# Patient Record
Sex: Male | Born: 1960 | Race: Black or African American | Hispanic: No | Marital: Single
Health system: Southern US, Community
[De-identification: ages and names within clinical notes are randomized; demographics above are authoritative.]

## PROBLEM LIST (undated history)

## (undated) DIAGNOSIS — F1010 Alcohol abuse, uncomplicated: Secondary | ICD-10-CM

## (undated) DIAGNOSIS — J45909 Unspecified asthma, uncomplicated: Secondary | ICD-10-CM

## (undated) DIAGNOSIS — I10 Essential (primary) hypertension: Secondary | ICD-10-CM

## (undated) DIAGNOSIS — F1910 Other psychoactive substance abuse, uncomplicated: Secondary | ICD-10-CM

## (undated) DIAGNOSIS — R569 Unspecified convulsions: Secondary | ICD-10-CM

---

## 1999-05-27 ENCOUNTER — Inpatient Hospital Stay (HOSPITAL_COMMUNITY): Admission: EM | Admit: 1999-05-27 | Discharge: 1999-06-04 | Payer: Self-pay | Admitting: Emergency Medicine

## 1999-05-27 ENCOUNTER — Encounter: Payer: Self-pay | Admitting: *Deleted

## 1999-05-29 ENCOUNTER — Encounter: Payer: Self-pay | Admitting: Internal Medicine

## 1999-05-29 ENCOUNTER — Encounter: Payer: Self-pay | Admitting: Family Medicine

## 1999-06-07 ENCOUNTER — Encounter: Admission: RE | Admit: 1999-06-07 | Discharge: 1999-06-07 | Payer: Self-pay | Admitting: Family Medicine

## 2001-08-16 ENCOUNTER — Emergency Department (HOSPITAL_COMMUNITY): Admission: EM | Admit: 2001-08-16 | Discharge: 2001-08-16 | Payer: Self-pay | Admitting: Emergency Medicine

## 2013-01-21 ENCOUNTER — Emergency Department: Payer: Self-pay | Admitting: Emergency Medicine

## 2013-01-21 LAB — TROPONIN I: Troponin-I: <0.02 ng/mL

## 2013-01-21 LAB — PROTIME-INR: Prothrombin Time: 12.2 secs (ref 11.5–14.7)

## 2013-01-21 LAB — CBC
HCT: 41.3 % (ref 40.0–52.0)
HGB: 13.9 g/dL (ref 13.0–18.0)
MCHC: 33.7 g/dL (ref 32.0–36.0)
MCV: 93 fL (ref 80–100)
RDW: 12.3 % (ref 11.5–14.5)

## 2013-01-21 LAB — BASIC METABOLIC PANEL
Anion Gap: 5 — ABNORMAL LOW (ref 7–16)
BUN: 23 mg/dL — ABNORMAL HIGH (ref 7–18)
Calcium, Total: 9.5 mg/dL (ref 8.5–10.1)
Chloride: 106 mmol/L (ref 98–107)
Co2: 23 mmol/L (ref 21–32)
EGFR (Non-African Amer.): >60
Glucose: 104 mg/dL — ABNORMAL HIGH (ref 65–99)
Sodium: 134 mmol/L — ABNORMAL LOW (ref 136–145)

## 2013-01-21 LAB — APTT: Activated PTT: 30 secs (ref 23.6–35.9)

## 2013-01-22 LAB — LIPID PANEL
HDL Cholesterol: 37 mg/dL — ABNORMAL LOW (ref 40–60)
Ldl Cholesterol, Calc: 113 mg/dL — ABNORMAL HIGH (ref 0–100)
Triglycerides: 99 mg/dL (ref 0–200)
VLDL Cholesterol, Calc: 20 mg/dL (ref 5–40)

## 2013-01-22 LAB — CBC WITH DIFFERENTIAL/PLATELET
Basophil #: 0 10*3/uL (ref 0.0–0.1)
Basophil %: 0.6 %
Eosinophil %: 6.4 %
Lymphocyte #: 2.2 10*3/uL (ref 1.0–3.6)
Monocyte %: 14.7 %
Platelet: 233 10*3/uL (ref 150–440)
RDW: 12.1 % (ref 11.5–14.5)

## 2013-01-22 LAB — TROPONIN I
Troponin-I: <0.02 ng/mL
Troponin-I: <0.02 ng/mL

## 2013-01-22 LAB — MAGNESIUM: Magnesium: 1.7 mg/dL — ABNORMAL LOW

## 2013-05-13 ENCOUNTER — Emergency Department (HOSPITAL_COMMUNITY)
Admission: EM | Admit: 2013-05-13 | Discharge: 2013-05-13 | Disposition: A | Discharge status: Home / Self Care | Payer: Medicaid Other | Attending: Emergency Medicine | Admitting: Emergency Medicine

## 2013-05-13 ENCOUNTER — Encounter (HOSPITAL_COMMUNITY): Payer: Self-pay | Admitting: Emergency Medicine

## 2013-05-13 ENCOUNTER — Emergency Department (HOSPITAL_COMMUNITY): Payer: Medicaid Other

## 2013-05-13 DIAGNOSIS — G40909 Epilepsy, unspecified, not intractable, without status epilepticus: Secondary | ICD-10-CM | POA: Insufficient documentation

## 2013-05-13 DIAGNOSIS — Z880 Allergy status to penicillin: Secondary | ICD-10-CM | POA: Insufficient documentation

## 2013-05-13 DIAGNOSIS — I10 Essential (primary) hypertension: Secondary | ICD-10-CM | POA: Insufficient documentation

## 2013-05-13 DIAGNOSIS — J45901 Unspecified asthma with (acute) exacerbation: Secondary | ICD-10-CM

## 2013-05-13 DIAGNOSIS — Z79899 Other long term (current) drug therapy: Secondary | ICD-10-CM | POA: Insufficient documentation

## 2013-05-13 DIAGNOSIS — F17200 Nicotine dependence, unspecified, uncomplicated: Secondary | ICD-10-CM | POA: Insufficient documentation

## 2013-05-13 HISTORY — DX: Other psychoactive substance abuse, uncomplicated: F19.10

## 2013-05-13 HISTORY — DX: Alcohol abuse, uncomplicated: F10.10

## 2013-05-13 HISTORY — DX: Essential (primary) hypertension: I10

## 2013-05-13 HISTORY — DX: Unspecified convulsions: R56.9

## 2013-05-13 HISTORY — DX: Unspecified asthma, uncomplicated: J45.909

## 2013-05-13 LAB — CBC
HCT: 41.6 % (ref 39.0–52.0)
HEMOGLOBIN: 13.4 g/dL (ref 13.0–17.0)
MCH: 31.6 pg (ref 26.0–34.0)
MCHC: 32.2 g/dL (ref 30.0–36.0)
MCV: 98.1 fL (ref 78.0–100.0)
Platelets: 269 10*3/uL (ref 150–400)
RBC: 4.24 MIL/uL (ref 4.22–5.81)
RDW: 13.2 % (ref 11.5–15.5)
WBC: 3.8 10*3/uL — ABNORMAL LOW (ref 4.0–10.5)

## 2013-05-13 LAB — BASIC METABOLIC PANEL
BUN: 13 mg/dL (ref 6–23)
CHLORIDE: 102 meq/L (ref 96–112)
CO2: 23 mEq/L (ref 19–32)
Calcium: 9.7 mg/dL (ref 8.4–10.5)
Creatinine, Ser: 1.03 mg/dL (ref 0.50–1.35)
GFR calc non Af Amer: 83 mL/min — ABNORMAL LOW (ref >90)
Glucose, Bld: 141 mg/dL — ABNORMAL HIGH (ref 70–99)
POTASSIUM: 5 meq/L (ref 3.7–5.3)
Sodium: 138 mEq/L (ref 137–147)

## 2013-05-13 LAB — POCT I-STAT TROPONIN I
TROPONIN I, POC: 0.01 ng/mL (ref 0.00–0.08)
Troponin i, poc: 0 ng/mL (ref 0.00–0.08)
Troponin i, poc: 0 ng/mL (ref 0.00–0.08)

## 2013-05-13 MED ORDER — ALBUTEROL SULFATE HFA 108 (90 BASE) MCG/ACT IN AERS
2.0000 | INHALATION_SPRAY | RESPIRATORY_TRACT | Status: AC | PRN
Start: 2013-05-13 — End: ?

## 2013-05-13 MED ORDER — PREDNISONE 20 MG PO TABS
60.0000 mg | ORAL_TABLET | Freq: Once | ORAL | Status: AC
  Administered 2013-05-13: 60 mg via ORAL
  Filled 2013-05-13: qty 3

## 2013-05-13 MED ORDER — PREDNISONE 20 MG PO TABS: 60.0000 mg | ORAL_TABLET | Freq: Every day | ORAL | Status: DC

## 2013-05-13 MED ORDER — ALBUTEROL SULFATE HFA 108 (90 BASE) MCG/ACT IN AERS
2.0000 | INHALATION_SPRAY | RESPIRATORY_TRACT | Status: DC | PRN
  Administered 2013-05-13: 2 via RESPIRATORY_TRACT
  Filled 2013-05-13: qty 6.7

## 2013-05-13 NOTE — ED Notes (Signed)
[facility] nurse reports will be here in 15 minutes.

## 2013-05-13 NOTE — ED Notes (Addendum)
EDP [place] aware of pt trending high BP, complaint of right sided chest pain, and last use of mariguana and cocaine on Monday. EDP verbalizes will look out for labs as they come back.

## 2013-05-13 NOTE — ED Notes (Signed)
Pt ride waiting in lobby.

## 2013-05-13 NOTE — Discharge Instructions (Signed)
Asthma, Adult Asthma is a recurring condition in which the airways tighten and narrow. Asthma can make it difficult to breathe. It can cause coughing, wheezing, and shortness of breath. Asthma episodes (also called asthma attacks) range from minor to life-threatening. Asthma cannot be cured, but medicines and lifestyle changes can help control it. CAUSES Asthma is believed to be caused by inherited (genetic) and environmental factors, but its exact cause is unknown. Asthma may be triggered by allergens, lung infections, or irritants in the air. Asthma triggers are different for each person. Common triggers include:   Animal dander.  Dust mites.  Cockroaches.  Pollen from trees or grass.  Mold.  Smoke.  Air pollutants such as dust, household cleaners, hair sprays, aerosol sprays, paint fumes, strong chemicals, or strong odors.  Cold air, weather changes, and winds (which increase molds and pollens in the air).  Strong emotional expressions such as crying or laughing hard.  Stress.  Certain medicines (such as aspirin) or types of drugs (such as beta-blockers).  Sulfites in foods and drinks. Foods and drinks that may contain sulfites include dried fruit, potato chips, and sparkling grape juice.  Infections or inflammatory conditions such as the flu, a cold, or an inflammation of the nasal membranes (rhinitis).  Gastroesophageal reflux disease (GERD).  Exercise or strenuous activity. SYMPTOMS Symptoms may occur immediately after asthma is triggered or many hours later. Symptoms include:  Wheezing.  Excessive nighttime or early morning coughing.  Frequent or severe coughing with a common cold.  Chest tightness.  Shortness of breath. DIAGNOSIS  The diagnosis of asthma is made by a review of your medical history and a physical exam. Tests may also be performed. These may include:  Lung function studies. These tests show how much air you breath in and out.  Allergy  tests.  Imaging tests such as X-rays. TREATMENT  Asthma cannot be cured, but it can usually be controlled. Treatment involves identifying and avoiding your asthma triggers. It also involves medicines. There are 2 classes of medicine used for asthma treatment:   Controller medicines. These prevent asthma symptoms from occurring. They are usually taken every day.  Reliever or rescue medicines. These quickly relieve asthma symptoms. They are used as needed and provide short-term relief. Your health care provider will help you create an asthma action plan. An asthma action plan is a written plan for managing and treating your asthma attacks. It includes a list of your asthma triggers and how they may be avoided. It also includes information on when medicines should be taken and when their dosage should be changed. An action plan may also involve the use of a device called a peak flow meter. A peak flow meter measures how well the lungs are working. It helps you monitor your condition. HOME CARE INSTRUCTIONS   Take medicine as directed by your health care provider. Speak with your health care provider if you have questions about how or when to take the medicines.  Use a peak flow meter as directed by your health care provider. Record and keep track of readings.  Understand and use the action plan to help minimize or stop an asthma attack without needing to seek medical care.  Control your home environment in the following ways to help prevent asthma attacks:  Do not smoke. Avoid being exposed to secondhand smoke.  Change your heating and air conditioning filter regularly.  Limit your use of fireplaces and wood stoves.  Get rid of pests (such as roaches and   mice) and their droppings.  Throw away plants if you see mold on them.  Clean your floors and dust regularly. Use unscented cleaning products.  Try to have someone else vacuum for you regularly. Stay out of rooms while they are being  vacuumed and for a short while afterward. If you vacuum, use a dust mask from a hardware store, a double-layered or microfilter vacuum cleaner bag, or a vacuum cleaner with a HEPA filter.  Replace carpet with wood, tile, or vinyl flooring. Carpet can trap dander and dust.  Use allergy-proof pillows, mattress covers, and box spring covers.  Wash bed sheets and blankets every week in hot water and dry them in a dryer.  Use blankets that are made of polyester or cotton.  Clean bathrooms and kitchens with bleach. If possible, have someone repaint the walls in these rooms with mold-resistant paint. Keep out of the rooms that are being cleaned and painted.  Wash hands frequently. SEEK MEDICAL CARE IF:   You have wheezing, shortness of breath, or a cough even if taking medicine to prevent attacks.  The colored mucus you cough up (sputum) is thicker than usual.  Your sputum changes from clear or white to yellow, green, gray, or bloody.  You have any problems that may be related to the medicines you are taking (such as a rash, itching, swelling, or trouble breathing).  You are using a reliever medicine more than 2 3 times per week.  Your peak flow is still at 50 79% of you personal best after following your action plan for 1 hour. SEEK IMMEDIATE MEDICAL CARE IF:   You seem to be getting worse and are unresponsive to treatment during an asthma attack.  You are short of breath even at rest.  You get short of breath when doing very Quint physical activity.  You have difficulty eating, drinking, or talking due to asthma symptoms.  You develop chest pain.  You develop a fast heartbeat.  You have a bluish color to your lips or fingernails.  You are lightheaded, dizzy, or faint.  Your peak flow is less than 50% of your personal best.  You have a fever or persistent symptoms for more than 2 3 days.  You have a fever and symptoms suddenly get worse. MAKE SURE YOU:   Understand these  instructions.  Will watch your condition.  Will get help right away if you are not doing well or get worse. Document Released: 03/26/2005 Document Revised: 11/26/2012 Document Reviewed: 10/23/2012 [employer] Patient Information 2014 [employer].  

## 2013-05-13 NOTE — ED Notes (Addendum)
[facility].   Upon discharge call [phone] for pt to get ride home.

## 2013-05-13 NOTE — ED Notes (Addendum)
Pt called [facility]/crisis nurse to pick him up after D/C.

## 2013-05-13 NOTE — ED Notes (Signed)
Pt transported to XRAY °

## 2013-05-13 NOTE — ED Notes (Signed)
MEAL GIVEN 

## 2013-05-13 NOTE — ED Notes (Signed)
I STAT TROPONIN drawn by Suann LarryJ Lennon. Unable to draw more labs. RN F Hasz notified and verbalizes will start US IV.
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
## 2013-05-13 NOTE — ED Notes (Signed)
IV attempt x 2 by this RN unsuccessful

## 2013-05-13 NOTE — ED Notes (Signed)
Bed: WA21 Expected date:  Expected time:  Means of arrival:  Comments: EMS  
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
## 2013-07-28 ENCOUNTER — Encounter (HOSPITAL_COMMUNITY): Payer: Self-pay | Admitting: Emergency Medicine

## 2013-07-28 ENCOUNTER — Emergency Department (HOSPITAL_COMMUNITY)
Admission: EM | Admit: 2013-07-28 | Discharge: 2013-07-28 | Disposition: A | Discharge status: Home / Self Care | Payer: Medicaid Other | Attending: Emergency Medicine | Admitting: Emergency Medicine

## 2013-07-28 DIAGNOSIS — IMO0002 Reserved for concepts with insufficient information to code with codable children: Secondary | ICD-10-CM | POA: Insufficient documentation

## 2013-07-28 DIAGNOSIS — Z79899 Other long term (current) drug therapy: Secondary | ICD-10-CM | POA: Insufficient documentation

## 2013-07-28 DIAGNOSIS — R51 Headache: Secondary | ICD-10-CM | POA: Insufficient documentation

## 2013-07-28 DIAGNOSIS — F1010 Alcohol abuse, uncomplicated: Secondary | ICD-10-CM | POA: Insufficient documentation

## 2013-07-28 DIAGNOSIS — F17200 Nicotine dependence, unspecified, uncomplicated: Secondary | ICD-10-CM | POA: Insufficient documentation

## 2013-07-28 DIAGNOSIS — G40909 Epilepsy, unspecified, not intractable, without status epilepticus: Secondary | ICD-10-CM

## 2013-07-28 DIAGNOSIS — Z888 Allergy status to other drugs, medicaments and biological substances status: Secondary | ICD-10-CM | POA: Insufficient documentation

## 2013-07-28 DIAGNOSIS — Z880 Allergy status to penicillin: Secondary | ICD-10-CM | POA: Insufficient documentation

## 2013-07-28 DIAGNOSIS — F1910 Other psychoactive substance abuse, uncomplicated: Secondary | ICD-10-CM | POA: Insufficient documentation

## 2013-07-28 DIAGNOSIS — J45909 Unspecified asthma, uncomplicated: Secondary | ICD-10-CM | POA: Insufficient documentation

## 2013-07-28 LAB — COMPREHENSIVE METABOLIC PANEL
ALBUMIN: 4.2 g/dL (ref 3.5–5.2)
ALT: 62 U/L — ABNORMAL HIGH (ref 0–53)
AST: 38 U/L — AB (ref 0–37)
Alkaline Phosphatase: 186 U/L — ABNORMAL HIGH (ref 39–117)
BUN: 14 mg/dL (ref 6–23)
CO2: 25 mEq/L (ref 19–32)
Calcium: 10.1 mg/dL (ref 8.4–10.5)
Chloride: 102 mEq/L (ref 96–112)
Creatinine, Ser: 1.02 mg/dL (ref 0.50–1.35)
GFR calc Af Amer: >90 mL/min (ref >90)
GFR calc non Af Amer: 84 mL/min — ABNORMAL LOW (ref >90)
Glucose, Bld: 100 mg/dL — ABNORMAL HIGH (ref 70–99)
Potassium: 4.7 mEq/L (ref 3.7–5.3)
Sodium: 142 mEq/L (ref 137–147)
TOTAL PROTEIN: 8.7 g/dL — AB (ref 6.0–8.3)
Total Bilirubin: 0.4 mg/dL (ref 0.3–1.2)

## 2013-07-28 LAB — RAPID URINE DRUG SCREEN, HOSP PERFORMED
Amphetamines: NOT DETECTED
BENZODIAZEPINES: NOT DETECTED
Barbiturates: NOT DETECTED
COCAINE: POSITIVE — AB
OPIATES: NOT DETECTED
TETRAHYDROCANNABINOL: POSITIVE — AB

## 2013-07-28 LAB — PHENYTOIN LEVEL, TOTAL: PHENYTOIN LVL: 15.3 ug/mL (ref 10.0–20.0)

## 2013-07-28 NOTE — ED Notes (Signed)
Patient transported to X-ray 

## 2013-07-28 NOTE — ED Notes (Signed)
Dr Felix PaciniJacobuwitz at bedside. Pt ambulates with cane. Steady gait. Food plate given. Ride called.

## 2013-07-28 NOTE — ED Provider Notes (Signed)
CSN: [id number]     Arrival date & time 07/28/13  1129 History   First MD Initiated Contact with Patient 07/28/13 1145     Chief Complaint  Patient presents with  . Seizures  . Hypertension     (Consider location/radiation/quality/duration/timing/severity/associated sxs/prior Treatment) Patient is a 53 y.o. male presenting with seizures and hypertension.  Seizures Hypertension Associated symptoms include headaches.   Patient had a seizure this morning at his drug treatment center. He states he missed one dose of Dilantin several days ago. Denies noncompliance with blood pressure medication. Present complains of a mild diffuse headache and feeling tired. No other complaint. No injury as result of seizure. No treatment prior to coming here. He is currently under treatment for cocaine and marijuana rehabilitation. Last used marijuana yesterday. Last used cocaine 2 days ago. No other associated symptoms. Headache is diffuse. Nothing makes symptoms better or worse. Treatment prior to coming here Past Medical History  Diagnosis Date  . Seizures   . Hypertension   . Asthma   . Substance abuse   . Alcohol abuse    History reviewed. No pertinent past surgical history. No family history on file. History  Substance Use Topics  . Smoking status: Current Every Day Smoker -- 0.50 packs/day    Types: Cigarettes  . Smokeless tobacco: Not on file  . Alcohol Use: Yes     Comment: beer    positive illicit drug use Review of Systems  Neurological: Positive for seizures and headaches.  All other systems reviewed and are negative.     Allergies  Aspirin; Other; Penicillins; and Rice  Home Medications   Prior to Admission medications   Medication Sig Start Date End Date Taking? Authorizing Provider  albuterol (PROVENTIL HFA;VENTOLIN HFA) 108 (90 BASE) MCG/ACT inhaler Inhale 2 puffs into the lungs every 4 (four) hours as needed for wheezing or shortness of breath. 05/13/13   Shon Batonourtney F  Horton, MD  docusate sodium (COLACE) 100 MG capsule Take 100 mg by mouth 2 (two) times daily.    Historical Provider, MD  labetalol (NORMODYNE) 300 MG tablet Take 300 mg by mouth 2 (two) times daily.    Historical Provider, MD  lisinopril (PRINIVIL,ZESTRIL) 20 MG tablet Take 20 mg by mouth daily.    Historical Provider, MD  mirtazapine (REMERON) 15 MG tablet Take 15 mg by mouth at bedtime.    Historical Provider, MD  OLANZapine (ZYPREXA) 10 MG tablet Take 10 mg by mouth 2 (two) times daily.    Historical Provider, MD  phenytoin (DILANTIN) 100 MG ER capsule Take 300 mg by mouth daily.    Historical Provider, MD  predniSONE (DELTASONE) 20 MG tablet Take 3 tablets (60 mg total) by mouth daily with breakfast. 05/13/13   Shon Batonourtney F Horton, MD   BP 170/117  Pulse 56  Temp(Src) 97.6 F (36.4 C) (Oral)  Resp 12  Ht 6' (1.829 m)  Wt 160 lb (72.576 kg)  BMI 21.70 kg/m2  SpO2 100% Physical Exam  Nursing note and vitals reviewed. Constitutional: He is oriented to person, place, and time. He appears well-developed and well-nourished.  HENT:  Head: Normocephalic and atraumatic.  Eyes: Conjunctivae are normal. Pupils are equal, round, and reactive to light.  Optic discs sharp bilaterally  Neck: Neck supple. No tracheal deviation present. No thyromegaly present.  Cardiovascular: Normal rate and regular rhythm.   No murmur heard. Pulmonary/Chest: Effort normal and breath sounds normal.  Abdominal: Soft. Bowel sounds are normal. He exhibits no distension. There  is no tenderness.  Musculoskeletal: Normal range of motion. He exhibits no edema and no tenderness.  Neurological: He is alert and oriented to person, place, and time. He has normal reflexes. No cranial nerve deficit. Coordination normal.  DTRs symmetric bilaterally at knee jerk ankle jerk and biceps was ordered bilaterally good motor strength 5 over 5 overall pronator drift negative  Skin: Skin is warm and dry. No rash noted.  Psychiatric: He  has a normal mood and affect.    ED Course  Procedures (including critical care time) Labs Review Labs Reviewed - No data to display  Imaging Review No results found.   EKG Interpretation None      2:45 PM patient is alert ambulates without difficulty with his cane. Asymptomatic. Glasgow Coma Score 15.  Results for orders placed during the hospital encounter of 07/28/13  COMPREHENSIVE METABOLIC PANEL      Result Value Ref Range   Sodium 142  137 - 147 mEq/L   Potassium 4.7  3.7 - 5.3 mEq/L   Chloride 102  96 - 112 mEq/L   CO2 25  19 - 32 mEq/L   Glucose, Bld 100 (*) 70 - 99 mg/dL   BUN 14  6 - 23 mg/dL   Creatinine, Ser 1.611.02  0.50 - 1.35 mg/dL   Calcium 09.610.1  8.4 - 04.510.5 mg/dL   Total Protein 8.7 (*) 6.0 - 8.3 g/dL   Albumin 4.2  3.5 - 5.2 g/dL   AST 38 (*) 0 - 37 U/L   ALT 62 (*) 0 - 53 U/L   Alkaline Phosphatase 186 (*) 39 - 117 U/L   Total Bilirubin 0.4  0.3 - 1.2 mg/dL   GFR calc non Af Amer 84 (*) >90 mL/min   GFR calc Af Amer >90  >90 mL/min  PHENYTOIN LEVEL, TOTAL      Result Value Ref Range   Phenytoin Lvl 15.3  10.0 - 20.0 ug/mL  URINE RAPID DRUG SCREEN (HOSP PERFORMED)      Result Value Ref Range   Opiates NONE DETECTED  NONE DETECTED   Cocaine POSITIVE (*) NONE DETECTED   Benzodiazepines NONE DETECTED  NONE DETECTED   Amphetamines NONE DETECTED  NONE DETECTED   Tetrahydrocannabinol POSITIVE (*) NONE DETECTED   Barbiturates NONE DETECTED  NONE DETECTED   No results found.  MDM  Patient exhibiting no signs of hypertensive encephalopathy. 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 Phenytoin level is therapeutic. Final diagnoses:  None   Plan he is to continue with current treatment program for cocaine and marijuana. Blood pressure recheck 2-3 weeks Diagnosis #1 seizure disorder #2 polysubstance abuse #3 hypertension     Doug SouSam Norma Montemurro, MD 07/28/13 1455

## 2013-07-28 NOTE — ED Notes (Signed)
MD at bedside. 

## 2013-07-28 NOTE — ED Notes (Signed)
At mental health outpatient where he receives treatment, Was found to hypertensive by staff, 220/110 and had Grand mal seizure lasted 3 minutes, EMS called, patient did not fall, was assisted by staff. Patient then had another petit mal with ems lasting 10 seconds, no tongue trauma noted, patient is now alert and oriented. EMS VS: CBG 91, BP: 203/125, HR 56, RR 18, SPO2, 100% RA 20 Left AC   Hx hypertensive and seizures, labetalol and lisinopril, and dilantin, is compliant with medications, took this morning, per EMS patient normally has seizures when he is hypertensive.

## 2013-07-28 NOTE — Discharge Instructions (Signed)
Epilepsy Take your medications as prescribed. Continue with your drug treatment program to stay off of cocaine and marijuana. Cocaine abuse can cause seizures.get your blood pressure recheck within the next 2 or 3 weeks. Today's was elevated at 148/101   People with epilepsy have times when they shake and jerk uncontrollably (seizures). This happens when there is a sudden change in brain function. Epilepsy may have many possible causes. Anything that disturbs the normal pattern of brain cell activity can lead to seizures. HOME CARE   Follow your doctor's instructions about driving and safety during normal activities.  Get enough sleep.  Only take medicine as told by your doctor.  Avoid things that you know can cause you to have seizures (triggers).  Write down when your seizures happen and what you remember about each seizure. Write down anything you think may have caused the seizure to happen.  Tell the people you live and work with that you have seizures. Make sure they know how to help you. They should:  Cushion your head and body.  Turn you on your side.  Not restrain you.  Not place anything inside your mouth.  Call for local emergency medical help if there is any question about what has happened.  Keep all follow-up visits with your doctor. This is very important. GET HELP IF:  You get an infection or start to feel sick. You may have more seizures when you are sick.  You are having seizures more often.  Your seizure pattern is changing. GET HELP RIGHT AWAY IF:   A seizure does not stop after a few seconds or minutes.  A seizure causes you to have trouble breathing.  A seizure gives you a very bad headache.  A seizure makes you unable to speak or use a part of your body. Document Released: 01/21/2009 Document Revised: 01/14/2013 Document Reviewed: 11/05/2012 [facility] Patient Information 2014 [place], [place].

## 2013-07-28 NOTE — ED Notes (Signed)
Family at bedside. 

## 2013-07-28 NOTE — ED Notes (Signed)
Pt alert x4 respirations easy no labored

## 2014-07-30 NOTE — Discharge Summary (Signed)
PATIENT NAME:  [name], Benjamin Clements DATE OF BIRTH:  [birth year]  DATE OF ADMISSION:  01/21/2013 DATE OF DISCHARGE:   01/22/2013  HOSPITAL COURSE:  A 54 year old gentleman with history of hypertension, coronary artery disease, apparently previous strokes, schizophrenia and seizure disorder, who is a smoker. He was brought by an Technical sales engineerofficer after being incarcerated and stating that for almost a month he has been having chest pain.   Since the pain was getting worse and worse during the last couple of days for what he requested this from a nurse, and he was sent to the Emergency Room. Pain was sharp in nature, but he also has a lot of component of muscle pain on his back. He has history of seizures, but he has not been on medications for a while. For history of hypertension, he is also off medications. His blood pressure has been stable, 120s/80s when he was here. On arrival, it was 142/95. His pulse was 60.   The patient had cardiac enzymes done, which were normal. He had some changes on his EKG in V5, V6, flattening of ST segments, which looked to be chronic, and the patient had an echocardiogram that did not show any abnormalities. Ejection fraction was normal at about 65% to 70%. No major valvulopathy.  The patient was ruled out for acute coronary syndrome and discharged home in good condition.   DISCHARGE MEDICATIONS:  Miconazole topical cream due to the patient apparently having been dealing with fungus on his feet, arms and groins. He also is going to have ibuprofen for back pain.   I spent about 35 minutes with this patient today.  ____________________________ Felipa Furnaceoberto Sanchez Gutierrez, MD rsg:dmm D: 01/22/2013 17:17:02 ET T: 01/22/2013 22:14:55 ET JOB#: [id number]  cc: Felipa Furnaceoberto Sanchez Gutierrez, MD, <Dictator> Lecia Esperanza Juanda ChanceSANCHEZ GUTIERRE MD ELECTRONICALLY SIGNED 01/31/2013 23:06

## 2014-07-30 NOTE — H&P (Signed)
PATIENT NAME:  Benjamin Clements, Benjamin Clements MR#:  [id number] DATE OF BIRTH:  1961-01-17  DATE OF ADMISSION:  01/21/2013  PRIMARY CARE PHYSICIAN: None.   REFERRING EMERGENCY ROOM PHYSICIAN: Dr. Sharman Cheek.   CHIEF COMPLAINT: Chest pain.   HISTORY OF PRESENTING ILLNESS: A 54 year old male with a past medical history of hypertension, coronary artery disease, stroke, schizophrenia and seizure disorder, who is a current smoker and who is in prison right now, brought in by an officer because of complaint of chest pain. The patient stated that for almost a month now, he has complaint of chest pain, which is on and off, sharp in his right lower chest, but today the pain was worse and was also going to both arms, and so he has requested to visit the nurse, and they sent him to the Emergency Room. He denies any palpitation. He denies this pain to be associated with any specific kind of activity. It is sharp. It comes on its own and resolves without any intervention within a few minutes. Troponin is negative, but because of his strong history, he is being admitted for further management on observation.     REVIEW OF SYSTEMS: CONSTITUTIONAL: Negative for fever, fatigue, weakness, pain or weight loss.  EYES: No blurry or double vision, discharge or redness.  EARS, NOSE, THROAT: No tinnitus, ear pain. No hearing loss.  RESPIRATORY: No cough, wheezing, hemoptysis or shortness of breath.  CARDIOVASCULAR: Chest pain. There is no orthopnea, edema, arrhythmia or palpitations.  GASTROINTESTINAL: No nausea, vomiting, diarrhea or abdominal pain.  GENITOURINARY: No dysuria, hematuria or increased frequency of urination.  ENDOCRINE: No increased sweating. No heat or cold intolerance.  SKIN: No acne, rashes or any lesions on the skin.  MUSCULOSKELETAL: No pain or swelling in the joints.  NEUROLOGICAL: No numbness, weakness, epilepsy, tremors or vertigo.  PSYCHIATRIC: No anxiety, insomnia or bipolar disorder.   PAST MEDICAL  HISTORY:  Hypertension, coronary artery disease, stroke, schizophrenia, seizure disorder and asthma.   PAST SURGICAL HISTORY:  None.   SOCIAL HISTORY:  He is a smoker, smokes half pack of cigarettes daily. Drinks alcohol. Was drinking 1 to 2 cans of beer every day, but last drink was 5 months ago. Denies any illegal drug use.   FAMILY HISTORY:  Mother has hypertension and asthma. Father has hypertension and diabetes.   MEDICATIONS:  Currently not taking any medication since he is locked up in jail.   PHYSICAL EXAMINATION: VITAL SIGNS: In ER, temperature 98, pulse 60, respirations 20, blood pressure 142/95 and pulse ox 97 on room air.  GENERAL: The patient is fully alert and oriented to time, place and person. He is not in any acute distress.  HEENT: Head and neck atraumatic. Conjunctivae pink. Oral mucosa moist.  NECK: Supple. No JVD.  RESPIRATORY: Bilateral clear and equal air entry.  CARDIOVASCULAR: S1, S2 present, regular. No murmur.  ABDOMEN: Soft, nontender. Bowel sounds present. No organomegaly.  SKIN: No rashes.  NEUROLOGICAL: Some speech problem, but that is chronic, as per him, since his stroke many years ago. Power 5/5 in all 4 limbs. No gross abnormality.  PSYCHIATRIC: Currently does not appear to have any acute psychiatric illness.   IMPORTANT IMAGING AND LABORATORY RESULTS: Chest x-ray, PA and lateral, mild atelectasis versus infiltrate, right lung base. Glucose 104, BUN 23, sodium 134, potassium 4.0, chloride 106, CO2 of 23, calcium 9.5. WBC 7, hemoglobin 13.9, platelet count 240, MCV  93.  Activated PTT 30.   ASSESSMENT AND PLAN: A 54 year old male with  multiple past medical histories, not taking medications regularly and current smoker, brought in by police as he is locked up in prison now,  came with complaint of chest pain.  1.  We will monitor him on telemetry and follow serial troponins, echocardiogram. We will give him aspirin, and lipid panel will be checked. His EKG  shows some changes from V5 and V6, but this might be old, though we do not have any old EKG to compare with.  2.  Smoker.  Smoking cessation counseling is done for 5 minutes, and he does not request to have a nicotine patch.  3.  Hypertension, currently stable, so we will not start on any medication.  4.  Coronary artery disease. We will continue aspirin and statin.  5.  Cerebral vascular accident. We will continue aspirin.  6.  Complaint of insomnia. We will give Benadryl as needed for sleep.   TOTAL TIME SPENT: 50 minutes for this admission.    ____________________________ Hope PigeonVaibhavkumar G. Elisabeth PigeonVachhani, MD vgv:dmm D: 01/21/2013 21:06:53 ET T: 01/21/2013 21:40:58 ET JOB#: [id number]  cc: Hope PigeonVaibhavkumar G. Elisabeth PigeonVachhani, MD, <Dictator> Altamese DillingVAIBHAVKUMAR Angeliyah Kirkey MD ELECTRONICALLY SIGNED 02/03/2013 0:25

## 2019-02-16 ENCOUNTER — Emergency Department (HOSPITAL_COMMUNITY): Payer: Medicaid Other

## 2019-02-16 ENCOUNTER — Other Ambulatory Visit: Payer: Self-pay

## 2019-02-16 ENCOUNTER — Encounter (HOSPITAL_COMMUNITY): Payer: Self-pay

## 2019-02-16 ENCOUNTER — Emergency Department (HOSPITAL_COMMUNITY)
Admission: EM | Admit: 2019-02-16 | Discharge: 2019-02-17 | Disposition: A | Discharge status: Home / Self Care | Payer: Medicaid Other | Attending: Emergency Medicine | Admitting: Emergency Medicine

## 2019-02-16 DIAGNOSIS — I10 Essential (primary) hypertension: Secondary | ICD-10-CM | POA: Diagnosis not present

## 2019-02-16 DIAGNOSIS — F1410 Cocaine abuse, uncomplicated: Secondary | ICD-10-CM | POA: Diagnosis not present

## 2019-02-16 DIAGNOSIS — F17210 Nicotine dependence, cigarettes, uncomplicated: Secondary | ICD-10-CM | POA: Insufficient documentation

## 2019-02-16 DIAGNOSIS — F1210 Cannabis abuse, uncomplicated: Secondary | ICD-10-CM

## 2019-02-16 DIAGNOSIS — Z79899 Other long term (current) drug therapy: Secondary | ICD-10-CM | POA: Diagnosis not present

## 2019-02-16 DIAGNOSIS — N289 Disorder of kidney and ureter, unspecified: Secondary | ICD-10-CM

## 2019-02-16 DIAGNOSIS — R569 Unspecified convulsions: Secondary | ICD-10-CM

## 2019-02-16 DIAGNOSIS — D7589 Other specified diseases of blood and blood-forming organs: Secondary | ICD-10-CM

## 2019-02-16 MED ORDER — LISINOPRIL 20 MG PO TABS
20.0000 mg | ORAL_TABLET | Freq: Once | ORAL | Status: AC
  Administered 2019-02-17: 20 mg via ORAL
  Filled 2019-02-16: qty 1

## 2019-02-16 MED ORDER — LABETALOL HCL 300 MG PO TABS
300.0000 mg | ORAL_TABLET | Freq: Once | ORAL | Status: AC
  Administered 2019-02-17: 300 mg via ORAL
  Filled 2019-02-16: qty 1
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
## 2019-02-17 LAB — COMPREHENSIVE METABOLIC PANEL
ALT: 17 U/L (ref 0–44)
AST: 24 U/L (ref 15–41)
Albumin: 3.6 g/dL (ref 3.5–5.0)
Alkaline Phosphatase: 87 U/L (ref 38–126)
Anion gap: 7 (ref 5–15)
BUN: 23 mg/dL — ABNORMAL HIGH (ref 6–20)
CO2: 23 mmol/L (ref 22–32)
Calcium: 9.4 mg/dL (ref 8.9–10.3)
Chloride: 106 mmol/L (ref 98–111)
Creatinine, Ser: 1.54 mg/dL — ABNORMAL HIGH (ref 0.61–1.24)
GFR calc Af Amer: 58 mL/min — ABNORMAL LOW (ref >60)
GFR calc non Af Amer: 50 mL/min — ABNORMAL LOW (ref >60)
Glucose, Bld: 130 mg/dL — ABNORMAL HIGH (ref 70–99)
Potassium: 4 mmol/L (ref 3.5–5.1)
Sodium: 136 mmol/L (ref 135–145)
Total Bilirubin: 0.4 mg/dL (ref 0.3–1.2)
Total Protein: 7.3 g/dL (ref 6.5–8.1)

## 2019-02-17 LAB — CBC WITH DIFFERENTIAL/PLATELET
Abs Immature Granulocytes: 0.01 10*3/uL (ref 0.00–0.07)
Basophils Absolute: 0 10*3/uL (ref 0.0–0.1)
Basophils Relative: 0 %
Eosinophils Absolute: 0.2 10*3/uL (ref 0.0–0.5)
Eosinophils Relative: 3 %
HCT: 41.6 % (ref 39.0–52.0)
Hemoglobin: 13.2 g/dL (ref 13.0–17.0)
Immature Granulocytes: 0 %
Lymphocytes Relative: 40 %
Lymphs Abs: 2 10*3/uL (ref 0.7–4.0)
MCH: 33.2 pg (ref 26.0–34.0)
MCHC: 31.7 g/dL (ref 30.0–36.0)
MCV: 104.5 fL — ABNORMAL HIGH (ref 80.0–100.0)
Monocytes Absolute: 0.7 10*3/uL (ref 0.1–1.0)
Monocytes Relative: 14 %
Neutro Abs: 2.1 10*3/uL (ref 1.7–7.7)
Neutrophils Relative %: 43 %
Platelets: 265 10*3/uL (ref 150–400)
RBC: 3.98 MIL/uL — ABNORMAL LOW (ref 4.22–5.81)
RDW: 12.2 % (ref 11.5–15.5)
WBC: 5.1 10*3/uL (ref 4.0–10.5)
nRBC: 0 % (ref 0.0–0.2)

## 2019-02-17 LAB — URINALYSIS, ROUTINE W REFLEX MICROSCOPIC
Bilirubin Urine: NEGATIVE
Glucose, UA: NEGATIVE mg/dL
Hgb urine dipstick: NEGATIVE
Ketones, ur: NEGATIVE mg/dL
Leukocytes,Ua: NEGATIVE
Nitrite: NEGATIVE
Protein, ur: NEGATIVE mg/dL
Specific Gravity, Urine: 1.006 (ref 1.005–1.030)
pH: 7 (ref 5.0–8.0)

## 2019-02-17 LAB — RAPID URINE DRUG SCREEN, HOSP PERFORMED
Amphetamines: NOT DETECTED
Barbiturates: NOT DETECTED
Benzodiazepines: NOT DETECTED
Cocaine: NOT DETECTED
Opiates: NOT DETECTED
Tetrahydrocannabinol: POSITIVE — AB

## 2019-02-17 LAB — PHENYTOIN LEVEL, TOTAL: Phenytoin Lvl: <2.5 ug/mL — ABNORMAL LOW (ref 10.0–20.0)

## 2019-02-17 LAB — ETHANOL: Alcohol, Ethyl (B): <10 mg/dL (ref <10)

## 2019-02-17 LAB — CBG MONITORING, ED: Glucose-Capillary: 120 mg/dL — ABNORMAL HIGH (ref 70–99)

## 2019-02-17 MED ORDER — LEVETIRACETAM 500 MG PO TABS: 500.0000 mg | ORAL_TABLET | Freq: Every day | ORAL | 0 refills | Status: AC

## 2019-02-17 MED ORDER — METOCLOPRAMIDE HCL 5 MG/ML IJ SOLN
10.0000 mg | Freq: Once | INTRAMUSCULAR | Status: AC
  Administered 2019-02-17: 10 mg via INTRAVENOUS
  Filled 2019-02-17: qty 2

## 2019-02-17 MED ORDER — SODIUM CHLORIDE 0.9 % IV BOLUS
1000.0000 mL | Freq: Once | INTRAVENOUS | Status: AC
  Administered 2019-02-17: 1000 mL via INTRAVENOUS

## 2019-02-17 MED ORDER — LEVETIRACETAM 500 MG PO TABS
1000.0000 mg | ORAL_TABLET | Freq: Once | ORAL | Status: AC
  Administered 2019-02-17: 1000 mg via ORAL
  Filled 2019-02-17: qty 2

## 2019-02-17 MED ORDER — SODIUM CHLORIDE 0.9 % IV SOLN
1000.0000 mg | Freq: Once | INTRAVENOUS | Status: AC
  Administered 2019-02-17: 1000 mg via INTRAVENOUS
  Filled 2019-02-17: qty 20

## 2019-02-17 MED ORDER — ONDANSETRON HCL 4 MG/2ML IJ SOLN
4.0000 mg | Freq: Once | INTRAMUSCULAR | Status: AC
  Administered 2019-02-17: 4 mg via INTRAVENOUS
  Filled 2019-02-17: qty 2

## 2019-02-17 NOTE — Discharge Instructions (Signed)
You need to take your seizure medication every day, or you will have seizures.

## 2019-02-17 NOTE — ED Notes (Signed)
In contact with a Marilye who states she will get pt's sister, Elder Negus and attempt to get her to come get pt. Pt becoming agitated.

## 2019-02-17 NOTE — ED Notes (Signed)
Pt resting peacefully. Woke pt to tell him we would be trying to call his brother again. Number originally given for 'caregiver' was wrong number. Called brother and left VM. Will attempt again.

## 2019-02-17 NOTE — ED Notes (Addendum)
Pt given d/c instructions and instructed to pick up prescriptions at his pharmacy. Pt also given meal, drink and shoes from collection closet. Wheeled to lobby in wheelchair. Steady gait in room. Sister on her way to pick up pt.

## 2019-02-17 NOTE — ED Notes (Signed)
Attempted Tina Chance's # in demographics which is disconnected and provided number for brother was connected to a man who stated that it was the wrong number

## 2019-02-17 NOTE — ED Notes (Signed)
Pt states that he is feeling sick and that his eyes are hurting. EDP notified.

## 2019-02-17 NOTE — ED Notes (Signed)
Attempted to call patients brother- [phone] And Caregiver-Felicia_[phone]  Pt doesn't have a way home and noone answer these two numbers at this time

## 2020-06-09 ENCOUNTER — Encounter (HOSPITAL_COMMUNITY): Payer: Self-pay | Admitting: Emergency Medicine

## 2020-06-09 ENCOUNTER — Emergency Department (HOSPITAL_COMMUNITY)
Admission: EM | Admit: 2020-06-09 | Discharge: 2020-06-09 | Disposition: A | Discharge status: Home / Self Care | Payer: Medicaid Other | Attending: Emergency Medicine | Admitting: Emergency Medicine

## 2020-06-09 ENCOUNTER — Other Ambulatory Visit: Payer: Self-pay

## 2020-06-09 DIAGNOSIS — J45909 Unspecified asthma, uncomplicated: Secondary | ICD-10-CM | POA: Diagnosis not present

## 2020-06-09 DIAGNOSIS — Z5900 Homelessness unspecified: Secondary | ICD-10-CM | POA: Diagnosis not present

## 2020-06-09 DIAGNOSIS — I10 Essential (primary) hypertension: Secondary | ICD-10-CM | POA: Diagnosis not present

## 2020-06-09 DIAGNOSIS — F17210 Nicotine dependence, cigarettes, uncomplicated: Secondary | ICD-10-CM | POA: Insufficient documentation

## 2020-06-09 DIAGNOSIS — R569 Unspecified convulsions: Secondary | ICD-10-CM | POA: Diagnosis not present

## 2020-06-09 DIAGNOSIS — F1910 Other psychoactive substance abuse, uncomplicated: Secondary | ICD-10-CM | POA: Insufficient documentation

## 2020-06-09 DIAGNOSIS — Z87898 Personal history of other specified conditions: Secondary | ICD-10-CM

## 2020-06-09 DIAGNOSIS — R110 Nausea: Secondary | ICD-10-CM | POA: Diagnosis not present

## 2020-06-09 LAB — COMPREHENSIVE METABOLIC PANEL
ALT: 20 U/L (ref 0–44)
AST: 32 U/L (ref 15–41)
Albumin: 4 g/dL (ref 3.5–5.0)
Alkaline Phosphatase: 81 U/L (ref 38–126)
Anion gap: 11 (ref 5–15)
BUN: 14 mg/dL (ref 6–20)
CO2: 24 mmol/L (ref 22–32)
Calcium: 10.2 mg/dL (ref 8.9–10.3)
Chloride: 103 mmol/L (ref 98–111)
Creatinine, Ser: 1.11 mg/dL (ref 0.61–1.24)
GFR, Estimated: >60 mL/min (ref >60)
Glucose, Bld: 208 mg/dL — ABNORMAL HIGH (ref 70–99)
Potassium: 3.9 mmol/L (ref 3.5–5.1)
Sodium: 138 mmol/L (ref 135–145)
Total Bilirubin: 0.8 mg/dL (ref 0.3–1.2)
Total Protein: 7.9 g/dL (ref 6.5–8.1)

## 2020-06-09 LAB — CBC WITH DIFFERENTIAL/PLATELET
Abs Immature Granulocytes: 0 10*3/uL (ref 0.00–0.07)
Basophils Absolute: 0 10*3/uL (ref 0.0–0.1)
Basophils Relative: 0 %
Eosinophils Absolute: 0.1 10*3/uL (ref 0.0–0.5)
Eosinophils Relative: 3 %
HCT: 45.8 % (ref 39.0–52.0)
Hemoglobin: 14.9 g/dL (ref 13.0–17.0)
Immature Granulocytes: 0 %
Lymphocytes Relative: 33 %
Lymphs Abs: 1.5 10*3/uL (ref 0.7–4.0)
MCH: 33.4 pg (ref 26.0–34.0)
MCHC: 32.5 g/dL (ref 30.0–36.0)
MCV: 102.7 fL — ABNORMAL HIGH (ref 80.0–100.0)
Monocytes Absolute: 0.5 10*3/uL (ref 0.1–1.0)
Monocytes Relative: 11 %
Neutro Abs: 2.5 10*3/uL (ref 1.7–7.7)
Neutrophils Relative %: 53 %
Platelets: 262 10*3/uL (ref 150–400)
RBC: 4.46 MIL/uL (ref 4.22–5.81)
RDW: 11.9 % (ref 11.5–15.5)
WBC: 4.7 10*3/uL (ref 4.0–10.5)
nRBC: 0 % (ref 0.0–0.2)

## 2020-06-09 LAB — RAPID URINE DRUG SCREEN, HOSP PERFORMED
Amphetamines: NOT DETECTED
Barbiturates: NOT DETECTED
Benzodiazepines: POSITIVE — AB
Cocaine: POSITIVE — AB
Opiates: NOT DETECTED
Tetrahydrocannabinol: POSITIVE — AB

## 2020-06-09 LAB — ETHANOL: Alcohol, Ethyl (B): <10 mg/dL (ref <10)

## 2020-06-09 MED ORDER — LEVETIRACETAM 500 MG PO TABS
500.0000 mg | ORAL_TABLET | Freq: Every day | ORAL | Status: DC
  Administered 2020-06-09: 500 mg via ORAL
  Filled 2020-06-09: qty 1

## 2020-06-09 MED ORDER — ONDANSETRON 4 MG PO TBDP
4.0000 mg | ORAL_TABLET | Freq: Once | ORAL | Status: AC
  Administered 2020-06-09: 4 mg via ORAL
  Filled 2020-06-09: qty 1

## 2020-06-09 MED ORDER — AMLODIPINE BESYLATE 5 MG PO TABS
10.0000 mg | ORAL_TABLET | Freq: Once | ORAL | Status: AC
  Administered 2020-06-09: 10 mg via ORAL
  Filled 2020-06-09: qty 2

## 2020-06-09 NOTE — ED Notes (Signed)
SW at bedside.

## 2020-06-09 NOTE — ED Provider Notes (Signed)
[facility] EMERGENCY DEPARTMENT Provider Note   CSN: [id number] Arrival date & time: 06/09/20  1105     History No chief complaint on file.   Benjamin Clements is a 60 y.o. male with PMHx polysubstance abuse, HTN, seizure d/o, presenting to the ED for medical clearance for rehab.  Patient states he went to [facility] today and was sent here for high blood pressure.  Per review of medical record in care everywhere, he was at the [facility] ED twice yesterday trying to obtain clearance to rehab.  He states he had a small seizure, which she has history of seizure disorder.  He was medically cleared after his first ED visit and went back to [facility].  While they were checking him in he was found to have high blood pressure and they sent him back to the ED.   His blood pressure was treated in the ED, however was after hours by that time.  He went back this morning, states he was sent back for medical clearance as he is noted to have a high blood pressure again today. There is some mention of [facility] in triage note as well, however patient is unsure and unable to provide specific details about why he is back in the ED.  States he is homeless and is unable to afford to get to the pharmacy to pick up his prescriptions.  Last used drugs and alcohol yesterday.  No seizures today.  He feels a Prater bit of stomach upset though attributes this to not eating over the last few days.  He also endorses chronic pain which is unchanged from baseline.  Endorses cocaine, marijuana, alcohol use.  Drinks about 20 beers per day.  The history is provided by the patient and medical records.       Past Medical History:  Diagnosis Date  . Alcohol abuse   . Asthma   . Hypertension   . Seizures (HCC)   . Substance abuse (HCC)     There are no problems to display for this patient.   History reviewed. No pertinent surgical history.     No family history on file.  Social History   Tobacco Use  .  Smoking status: Current Every Day Smoker    Packs/day: 0.50    Types: Cigarettes  Substance Use Topics  . Alcohol use: Yes    Comment: beer  . Drug use: Yes    Types: Cocaine, Marijuana    Comment: last use Monday    Home Medications Prior to Admission medications   Medication Sig Start Date End Date Taking? Authorizing Provider  albuterol (PROVENTIL HFA;VENTOLIN HFA) 108 (90 BASE) MCG/ACT inhaler Inhale 2 puffs into the lungs every 4 (four) hours as needed for wheezing or shortness of breath. 05/13/13   Shon Baton, MD  aspirin EC 81 MG tablet Take 81 mg by mouth daily.    [provider]  levETIRAcetam (KEPPRA) 500 MG tablet Take 1 tablet (500 mg total) by mouth daily. 02/17/19   Dione Booze, MD  lisinopril (PRINIVIL,ZESTRIL) 20 MG tablet Take 20 mg by mouth daily.    [provider]  mirtazapine (REMERON) 15 MG tablet Take 15 mg by mouth at bedtime.    [provider]    Allergies    Iodine-131, Other, Rice, Aspirin, Latex, Penicillins, and Tape  Review of Systems   Review of Systems  Gastrointestinal: Positive for nausea.  Musculoskeletal: Positive for myalgias.  All other systems reviewed and  are negative.   Physical Exam Updated Vital Signs BP (!) 156/103   Pulse 80   Temp 98.2 F (36.8 C) (Oral)   Resp 15   SpO2 100%   Physical Exam Vitals and nursing note reviewed.  Constitutional:      General: He is not in acute distress.    Appearance: He is well-developed and well-nourished.  HENT:     Head: Normocephalic and atraumatic.  Eyes:     Extraocular Movements: Extraocular movements intact.     Conjunctiva/sclera: Conjunctivae normal.     Pupils: Pupils are equal, round, and reactive to light.  Cardiovascular:     Rate and Rhythm: Normal rate and regular rhythm.  Pulmonary:     Effort: Pulmonary effort is normal. No respiratory distress.     Breath sounds: Normal breath sounds.  Abdominal:     Palpations: Abdomen is soft.   Skin:    General: Skin is warm.  Neurological:     Mental Status: He is alert. Mental status is at baseline.  Psychiatric:        Mood and Affect: Mood and affect normal.        Behavior: Behavior normal.     ED Results / Procedures / Treatments   Labs (all labs ordered are listed, but only abnormal results are displayed) Labs Reviewed  COMPREHENSIVE METABOLIC PANEL - Abnormal; Notable for the following components:      Result Value   Glucose, Bld 208 (*)    All other components within normal limits  RAPID URINE DRUG SCREEN, HOSP PERFORMED - Abnormal; Notable for the following components:   Cocaine POSITIVE (*)    Benzodiazepines POSITIVE (*)    Tetrahydrocannabinol POSITIVE (*)    All other components within normal limits  CBC WITH DIFFERENTIAL/PLATELET - Abnormal; Notable for the following components:   MCV 102.7 (*)    All other components within normal limits  ETHANOL    EKG None  Radiology No results found.  Procedures Procedures   Medications Ordered in ED Medications  levETIRAcetam (KEPPRA XR) 24 hr tablet 500 mg (has no administration in time range)  amLODipine (NORVASC) tablet 10 mg (has no administration in time range)  ondansetron (ZOFRAN-ODT) disintegrating tablet 4 mg (4 mg Oral Given 06/09/20 1846)    ED Course  I have reviewed the triage vital signs and the nursing notes.  Pertinent labs & imaging results that were available during my care of the patient were reviewed by me and considered in my medical decision making (see chart for details).    MDM Rules/Calculators/A&P                         Patient is presenting for medical clearance for rehab.  It appears he is not been accepted into the rehab facility because he has been having high blood pressure upon check-in.  However, he is homeless and is having difficulty obtaining his blood pressure prescriptions from the pharmacy due to affording transport.  He is having some stomach upset today and  some chronic myalgias though no seizures today or concerning symptoms of acute alcohol withdrawal.  His nausea is treated.  He is also given his home daily dose of Keppra and Norvasc.  Discussed with social work.  She will provide patient with cab voucher to the pharmacy.  States he can have a co-pay override by the pharmacist at the [facility] for his prescriptions.  His clearance labs are unremarkable for concerning  findings.  UDS is positive.  Alcohol level is undetectable.   Patient now stating he already has his prescriptions at a pharmacy.  He just needs assistance getting to that pharmacy.  Social work is providing patient with transportation to the pharmacy to pick up his medications.  Patient is discharged in no acute distress.   Final Clinical Impression(s) / ED Diagnoses Final diagnoses:  Polysubstance abuse (HCC)  Hypertension, unspecified type  History of seizure    Rx / DC Orders ED Discharge Orders    None       Finnegan Gatta, [place] N, PA-C 06/09/20 2021    Arby Barrette, MD 06/14/20 1711

## 2020-06-09 NOTE — ED Triage Notes (Signed)
Pt here today requesting clearance for detox , sent over by  [facility] , pt was at [facility] ED twice yesterday

## 2020-06-09 NOTE — Progress Notes (Signed)
CSW met with Pt at bedside. Originally Pt had informed EDP that he needed meds to be accepted back to detox and CSW was going to send Pt to pharmacy via taxi to pick up meds. Pt became agitated and load and stated that he did not need meds as they were already prescribed at pharmacy in [facility] and that he would be able to have a place to sleep tonight if he could get back to [place]. CSW deescalated Pt.  Melburn Popper was arranged to [place] via [employer]. EDP updated

## 2020-06-09 NOTE — Discharge Instructions (Addendum)
It is important you take your medications as directed daily for control of your blood pressure and seizure disorder. You are medically cleared today for your rehabilitation. You have been provided with transportation to your pharmacy in order to pick up your prescriptions.  Your insurance should cover your prescriptions.

## 2020-11-22 ENCOUNTER — Emergency Department (HOSPITAL_COMMUNITY): Payer: Medicaid Other

## 2020-11-22 ENCOUNTER — Inpatient Hospital Stay (HOSPITAL_COMMUNITY)
Admission: EM | Admit: 2020-11-22 | Discharge: 2020-12-19 | DRG: 981 | Disposition: A | Discharge status: Home / Self Care | Payer: Medicaid Other | Attending: General Surgery | Admitting: General Surgery

## 2020-11-22 DIAGNOSIS — M25572 Pain in left ankle and joints of left foot: Secondary | ICD-10-CM

## 2020-11-22 DIAGNOSIS — T1490XA Injury, unspecified, initial encounter: Secondary | ICD-10-CM

## 2020-11-22 DIAGNOSIS — R0989 Other specified symptoms and signs involving the circulatory and respiratory systems: Secondary | ICD-10-CM

## 2020-11-22 DIAGNOSIS — Z0189 Encounter for other specified special examinations: Secondary | ICD-10-CM

## 2020-11-22 DIAGNOSIS — S36039A Unspecified laceration of spleen, initial encounter: Secondary | ICD-10-CM | POA: Diagnosis present

## 2020-11-22 DIAGNOSIS — Z978 Presence of other specified devices: Secondary | ICD-10-CM

## 2020-11-22 DIAGNOSIS — Z4659 Encounter for fitting and adjustment of other gastrointestinal appliance and device: Secondary | ICD-10-CM

## 2020-11-22 DIAGNOSIS — S36899A Unspecified injury of other intra-abdominal organs, initial encounter: Principal | ICD-10-CM

## 2020-11-22 DIAGNOSIS — Z9289 Personal history of other medical treatment: Secondary | ICD-10-CM

## 2020-11-22 DIAGNOSIS — J9690 Respiratory failure, unspecified, unspecified whether with hypoxia or hypercapnia: Secondary | ICD-10-CM

## 2020-11-22 DIAGNOSIS — K567 Ileus, unspecified: Secondary | ICD-10-CM

## 2020-11-22 DIAGNOSIS — Z781 Physical restraint status: Secondary | ICD-10-CM | POA: Diagnosis not present

## 2020-11-22 DIAGNOSIS — R1310 Dysphagia, unspecified: Secondary | ICD-10-CM | POA: Diagnosis present

## 2020-11-22 DIAGNOSIS — I10 Essential (primary) hypertension: Secondary | ICD-10-CM | POA: Diagnosis present

## 2020-11-22 DIAGNOSIS — D62 Acute posthemorrhagic anemia: Secondary | ICD-10-CM | POA: Diagnosis present

## 2020-11-22 DIAGNOSIS — W228XXA Striking against or struck by other objects, initial encounter: Secondary | ICD-10-CM | POA: Diagnosis present

## 2020-11-22 DIAGNOSIS — S2232XA Fracture of one rib, left side, initial encounter for closed fracture: Secondary | ICD-10-CM | POA: Diagnosis present

## 2020-11-22 DIAGNOSIS — F10239 Alcohol dependence with withdrawal, unspecified: Secondary | ICD-10-CM | POA: Diagnosis present

## 2020-11-22 DIAGNOSIS — R569 Unspecified convulsions: Secondary | ICD-10-CM | POA: Diagnosis present

## 2020-11-22 DIAGNOSIS — R000 Tachycardia, unspecified: Secondary | ICD-10-CM | POA: Diagnosis present

## 2020-11-22 DIAGNOSIS — E876 Hypokalemia: Secondary | ICD-10-CM | POA: Diagnosis present

## 2020-11-22 DIAGNOSIS — S3609XA Other injury of spleen, initial encounter: Secondary | ICD-10-CM | POA: Diagnosis present

## 2020-11-22 DIAGNOSIS — Y906 Blood alcohol level of 120-199 mg/100 ml: Secondary | ICD-10-CM | POA: Diagnosis present

## 2020-11-22 DIAGNOSIS — Z20822 Contact with and (suspected) exposure to covid-19: Secondary | ICD-10-CM | POA: Diagnosis present

## 2020-11-22 DIAGNOSIS — J9601 Acute respiratory failure with hypoxia: Secondary | ICD-10-CM | POA: Diagnosis present

## 2020-11-22 LAB — COMPREHENSIVE METABOLIC PANEL
ALT: 19 U/L (ref 0–44)
AST: 29 U/L (ref 15–41)
Albumin: 3.6 g/dL (ref 3.5–5.0)
Alkaline Phosphatase: 67 U/L (ref 38–126)
Anion gap: 10 (ref 5–15)
BUN: 13 mg/dL (ref 6–20)
CO2: 19 mmol/L — ABNORMAL LOW (ref 22–32)
Calcium: 9.1 mg/dL (ref 8.9–10.3)
Chloride: 110 mmol/L (ref 98–111)
Creatinine, Ser: 1.2 mg/dL (ref 0.61–1.24)
GFR, Estimated: >60 mL/min (ref >60)
Glucose, Bld: 107 mg/dL — ABNORMAL HIGH (ref 70–99)
Potassium: 3.8 mmol/L (ref 3.5–5.1)
Sodium: 139 mmol/L (ref 135–145)
Total Bilirubin: 0.6 mg/dL (ref 0.3–1.2)
Total Protein: 6.5 g/dL (ref 6.5–8.1)

## 2020-11-22 LAB — I-STAT CHEM 8, ED
BUN: 12 mg/dL (ref 6–20)
Calcium, Ion: 1.1 mmol/L — ABNORMAL LOW (ref 1.15–1.40)
Chloride: 108 mmol/L (ref 98–111)
Creatinine, Ser: 1.4 mg/dL — ABNORMAL HIGH (ref 0.61–1.24)
Glucose, Bld: 105 mg/dL — ABNORMAL HIGH (ref 70–99)
HCT: 38 % — ABNORMAL LOW (ref 39.0–52.0)
Hemoglobin: 12.9 g/dL — ABNORMAL LOW (ref 13.0–17.0)
Potassium: 3.7 mmol/L (ref 3.5–5.1)
Sodium: 141 mmol/L (ref 135–145)
TCO2: 19 mmol/L — ABNORMAL LOW (ref 22–32)

## 2020-11-22 LAB — ETHANOL: Alcohol, Ethyl (B): 184 mg/dL — ABNORMAL HIGH (ref <10)

## 2020-11-22 LAB — CBC
HCT: 35.4 % — ABNORMAL LOW (ref 39.0–52.0)
Hemoglobin: 11.4 g/dL — ABNORMAL LOW (ref 13.0–17.0)
MCH: 32.8 pg (ref 26.0–34.0)
MCHC: 32.2 g/dL (ref 30.0–36.0)
MCV: 101.7 fL — ABNORMAL HIGH (ref 80.0–100.0)
Platelets: 293 10*3/uL (ref 150–400)
RBC: 3.48 MIL/uL — ABNORMAL LOW (ref 4.22–5.81)
RDW: 12.7 % (ref 11.5–15.5)
WBC: 4.3 10*3/uL (ref 4.0–10.5)
nRBC: 0 % (ref 0.0–0.2)

## 2020-11-22 LAB — SAMPLE TO BLOOD BANK

## 2020-11-22 LAB — PROTIME-INR
INR: 1 (ref 0.8–1.2)
Prothrombin Time: 12.9 seconds (ref 11.4–15.2)

## 2020-11-22 LAB — LACTIC ACID, PLASMA: Lactic Acid, Venous: 2.3 mmol/L (ref 0.5–1.9)

## 2020-11-22 LAB — RESP PANEL BY RT-PCR (FLU A&B, COVID) ARPGX2
Influenza A by PCR: NEGATIVE
Influenza B by PCR: NEGATIVE
SARS Coronavirus 2 by RT PCR: NEGATIVE

## 2020-11-22 LAB — ABO/RH: ABO/RH(D): O POS

## 2020-11-22 MED ORDER — FENTANYL 2500MCG IN NS 250ML (10MCG/ML) PREMIX INFUSION
0.0000 ug/h | INTRAVENOUS | Status: DC
  Administered 2020-11-22: 75 ug/h via INTRAVENOUS
  Administered 2020-11-23: 150 ug/h via INTRAVENOUS
  Filled 2020-11-22 (×2): qty 250

## 2020-11-22 MED ORDER — FENTANYL CITRATE (PF) 100 MCG/2ML IJ SOLN: 50.0000 ug | INTRAMUSCULAR | Status: DC | PRN

## 2020-11-22 MED ORDER — POLYETHYLENE GLYCOL 3350 17 G PO PACK
17.0000 g | PACK | Freq: Every day | ORAL | Status: DC
  Administered 2020-11-23: 17 g
  Filled 2020-11-22: qty 1

## 2020-11-22 MED ORDER — PROPOFOL 10 MG/ML IV BOLUS
INTRAVENOUS | Status: AC
  Filled 2020-11-22: qty 20

## 2020-11-22 MED ORDER — LIDOCAINE HCL 1 % IJ SOLN
INTRAMUSCULAR | Status: AC
  Filled 2020-11-22: qty 20

## 2020-11-22 MED ORDER — ONDANSETRON HCL 4 MG/2ML IJ SOLN: 4.0000 mg | Freq: Four times a day (QID) | INTRAMUSCULAR | Status: DC | PRN

## 2020-11-22 MED ORDER — LEVETIRACETAM IN NACL 1000 MG/100ML IV SOLN
1000.0000 mg | INTRAVENOUS | Status: AC
  Filled 2020-11-22: qty 100

## 2020-11-22 MED ORDER — LORAZEPAM 2 MG/ML IJ SOLN: 2.0000 mg | Freq: Once | INTRAMUSCULAR | Status: AC

## 2020-11-22 MED ORDER — PANTOPRAZOLE SODIUM 40 MG PO TBEC
40.0000 mg | DELAYED_RELEASE_TABLET | Freq: Every day | ORAL | Status: DC
  Administered 2020-11-27: 40 mg via ORAL
  Filled 2020-11-22 (×3): qty 1

## 2020-11-22 MED ORDER — ETOMIDATE 2 MG/ML IV SOLN
INTRAVENOUS | Status: AC | PRN
  Administered 2020-11-22: 20 mg via INTRAVENOUS

## 2020-11-22 MED ORDER — PROPOFOL 1000 MG/100ML IV EMUL
0.0000 ug/kg/min | INTRAVENOUS | Status: DC
  Administered 2020-11-23: 25 ug/kg/min via INTRAVENOUS
  Administered 2020-11-23: 30 ug/kg/min via INTRAVENOUS
  Administered 2020-11-24: 25 ug/kg/min via INTRAVENOUS
  Filled 2020-11-22 (×3): qty 100

## 2020-11-22 MED ORDER — OXYCODONE HCL 5 MG PO TABS: 5.0000 mg | ORAL_TABLET | ORAL | Status: DC | PRN

## 2020-11-22 MED ORDER — LORAZEPAM 2 MG/ML IJ SOLN
INTRAMUSCULAR | Status: AC
  Administered 2020-11-22: 2 mg via INTRAVENOUS
  Filled 2020-11-22: qty 1

## 2020-11-22 MED ORDER — IOHEXOL 300 MG/ML  SOLN: 150.0000 mL | Freq: Once | INTRAMUSCULAR | Status: DC | PRN

## 2020-11-22 MED ORDER — PROPOFOL 1000 MG/100ML IV EMUL
INTRAVENOUS | Status: AC
  Administered 2020-11-22: 20 ug/kg/min via INTRAVENOUS
  Filled 2020-11-22: qty 100

## 2020-11-22 MED ORDER — PANTOPRAZOLE SODIUM 40 MG IV SOLR
40.0000 mg | Freq: Every day | INTRAVENOUS | Status: DC
  Administered 2020-11-23 – 2020-11-30 (×7): 40 mg via INTRAVENOUS
  Filled 2020-11-22 (×8): qty 40

## 2020-11-22 MED ORDER — PROPOFOL 1000 MG/100ML IV EMUL
5.0000 ug/kg/min | INTRAVENOUS | Status: DC
  Administered 2020-11-22: 5 ug/kg/min via INTRAVENOUS

## 2020-11-22 MED ORDER — ROCURONIUM BROMIDE 50 MG/5ML IV SOLN
INTRAVENOUS | Status: AC | PRN
Start: 2020-11-22 — End: 2020-11-22
  Administered 2020-11-22: 50 mg via INTRAVENOUS

## 2020-11-22 MED ORDER — ACETAMINOPHEN 500 MG PO TABS: 1000.0000 mg | ORAL_TABLET | Freq: Four times a day (QID) | ORAL | Status: DC

## 2020-11-22 MED ORDER — IOHEXOL 300 MG/ML  SOLN
50.0000 mL | Freq: Once | INTRAMUSCULAR | Status: AC | PRN
  Administered 2020-11-23: 16 mL via INTRATHECAL

## 2020-11-22 MED ORDER — IOHEXOL 350 MG/ML SOLN
80.0000 mL | Freq: Once | INTRAVENOUS | Status: AC | PRN
  Administered 2020-11-22: 80 mL via INTRAVENOUS

## 2020-11-22 MED ORDER — ONDANSETRON 4 MG PO TBDP: 4.0000 mg | ORAL_TABLET | Freq: Four times a day (QID) | ORAL | Status: DC | PRN

## 2020-11-22 MED ORDER — DOCUSATE SODIUM 100 MG PO CAPS: 100.0000 mg | ORAL_CAPSULE | Freq: Two times a day (BID) | ORAL | Status: DC

## 2020-11-22 MED ORDER — LACTATED RINGERS IV SOLN
INTRAVENOUS | Status: DC
  Administered 2020-11-23: 100 mL/h via INTRAVENOUS
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
## 2020-11-22 NOTE — ED Notes (Signed)
Pt removed C Collar. Md notified

## 2020-11-22 NOTE — ED Notes (Addendum)
TRN with pt in IR. IR staff requested TRN stay as it is a newly intubated pt. Pt on propofol and fentanyl
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
## 2020-11-22 NOTE — ED Notes (Signed)
Pt family called to get information, I notified pt family I ws putting her on hold to message the nurse find out if nurse was available to speak with them. Pt hung the phone up .will transfer to DR tegeler if family calls back
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
## 2020-11-23 ENCOUNTER — Encounter (HOSPITAL_COMMUNITY): Payer: Self-pay | Admitting: Emergency Medicine

## 2020-11-23 ENCOUNTER — Inpatient Hospital Stay (HOSPITAL_COMMUNITY): Payer: Medicaid Other

## 2020-11-23 HISTORY — PX: IR ANGIOGRAM VISCERAL SELECTIVE: IMG657

## 2020-11-23 HISTORY — PX: IR EMBO ART  VEN HEMORR LYMPH EXTRAV  INC GUIDE ROADMAPPING: IMG5450

## 2020-11-23 HISTORY — PX: IR US GUIDE VASC ACCESS RIGHT: IMG2390

## 2020-11-23 LAB — URINALYSIS, ROUTINE W REFLEX MICROSCOPIC
Bilirubin Urine: NEGATIVE
Glucose, UA: 150 mg/dL — AB
Hgb urine dipstick: NEGATIVE
Ketones, ur: NEGATIVE mg/dL
Leukocytes,Ua: NEGATIVE
Nitrite: NEGATIVE
Protein, ur: NEGATIVE mg/dL
Specific Gravity, Urine: 1.032 — ABNORMAL HIGH (ref 1.005–1.030)
pH: 6 (ref 5.0–8.0)

## 2020-11-23 LAB — PREPARE FRESH FROZEN PLASMA
Unit division: 0
Unit division: 0
Unit division: 0
Unit division: 0

## 2020-11-23 LAB — BPAM RBC
Blood Product Expiration Date: 202209012359
Blood Product Expiration Date: 202209032359
Blood Product Expiration Date: 202209192359
Blood Product Expiration Date: 202209202359
ISSUE DATE / TIME: 202208162243
ISSUE DATE / TIME: 202208162243
ISSUE DATE / TIME: 202208162254
ISSUE DATE / TIME: 202208162254
Unit Type and Rh: 5100
Unit Type and Rh: 5100
Unit Type and Rh: 5100
Unit Type and Rh: 5100

## 2020-11-23 LAB — TYPE AND SCREEN
ABO/RH(D): O POS
Antibody Screen: NEGATIVE
Unit division: 0
Unit division: 0
Unit division: 0
Unit division: 0

## 2020-11-23 LAB — CBC
HCT: 34.9 % — ABNORMAL LOW (ref 39.0–52.0)
HCT: 32.5 % — ABNORMAL LOW (ref 39.0–52.0)
HCT: 31.3 % — ABNORMAL LOW (ref 39.0–52.0)
Hemoglobin: 11.4 g/dL — ABNORMAL LOW (ref 13.0–17.0)
Hemoglobin: 11 g/dL — ABNORMAL LOW (ref 13.0–17.0)
Hemoglobin: 10.1 g/dL — ABNORMAL LOW (ref 13.0–17.0)
MCH: 30.9 pg (ref 26.0–34.0)
MCH: 31.2 pg (ref 26.0–34.0)
MCH: 30.7 pg (ref 26.0–34.0)
MCHC: 32.7 g/dL (ref 30.0–36.0)
MCHC: 33.8 g/dL (ref 30.0–36.0)
MCHC: 32.3 g/dL (ref 30.0–36.0)
MCV: 94.6 fL (ref 80.0–100.0)
MCV: 92.1 fL (ref 80.0–100.0)
MCV: 95.1 fL (ref 80.0–100.0)
Platelets: 146 10*3/uL — ABNORMAL LOW (ref 150–400)
Platelets: 146 10*3/uL — ABNORMAL LOW (ref 150–400)
Platelets: 121 10*3/uL — ABNORMAL LOW (ref 150–400)
RBC: 3.69 MIL/uL — ABNORMAL LOW (ref 4.22–5.81)
RBC: 3.53 MIL/uL — ABNORMAL LOW (ref 4.22–5.81)
RBC: 3.29 MIL/uL — ABNORMAL LOW (ref 4.22–5.81)
RDW: 17.6 % — ABNORMAL HIGH (ref 11.5–15.5)
RDW: 18.5 % — ABNORMAL HIGH (ref 11.5–15.5)
RDW: 18.6 % — ABNORMAL HIGH (ref 11.5–15.5)
WBC: 14.4 10*3/uL — ABNORMAL HIGH (ref 4.0–10.5)
WBC: 14.1 10*3/uL — ABNORMAL HIGH (ref 4.0–10.5)
WBC: 15.6 10*3/uL — ABNORMAL HIGH (ref 4.0–10.5)
nRBC: 0 % (ref 0.0–0.2)
nRBC: 0 % (ref 0.0–0.2)
nRBC: 0 % (ref 0.0–0.2)

## 2020-11-23 LAB — POCT I-STAT 7, (LYTES, BLD GAS, ICA,H+H)
Acid-base deficit: 7 mmol/L — ABNORMAL HIGH (ref 0.0–2.0)
Bicarbonate: 20.2 mmol/L (ref 20.0–28.0)
Calcium, Ion: 1.21 mmol/L (ref 1.15–1.40)
HCT: 36 % — ABNORMAL LOW (ref 39.0–52.0)
Hemoglobin: 12.2 g/dL — ABNORMAL LOW (ref 13.0–17.0)
O2 Saturation: 100 %
Patient temperature: 97.6
Potassium: 3.7 mmol/L (ref 3.5–5.1)
Sodium: 140 mmol/L (ref 135–145)
TCO2: 22 mmol/L (ref 22–32)
pCO2 arterial: 45.9 mmHg (ref 32.0–48.0)
pH, Arterial: 7.248 — ABNORMAL LOW (ref 7.350–7.450)
pO2, Arterial: 348 mmHg — ABNORMAL HIGH (ref 83.0–108.0)

## 2020-11-23 LAB — COMPREHENSIVE METABOLIC PANEL
ALT: 19 U/L (ref 0–44)
AST: 30 U/L (ref 15–41)
Albumin: 3.4 g/dL — ABNORMAL LOW (ref 3.5–5.0)
Alkaline Phosphatase: 60 U/L (ref 38–126)
Anion gap: 7 (ref 5–15)
BUN: 12 mg/dL (ref 6–20)
CO2: 24 mmol/L (ref 22–32)
Calcium: 8.6 mg/dL — ABNORMAL LOW (ref 8.9–10.3)
Chloride: 107 mmol/L (ref 98–111)
Creatinine, Ser: 1.07 mg/dL (ref 0.61–1.24)
GFR, Estimated: >60 mL/min (ref >60)
Glucose, Bld: 150 mg/dL — ABNORMAL HIGH (ref 70–99)
Potassium: 3.9 mmol/L (ref 3.5–5.1)
Sodium: 138 mmol/L (ref 135–145)
Total Bilirubin: 0.9 mg/dL (ref 0.3–1.2)
Total Protein: 6.3 g/dL — ABNORMAL LOW (ref 6.5–8.1)

## 2020-11-23 LAB — BASIC METABOLIC PANEL
Anion gap: 10 (ref 5–15)
BUN: 13 mg/dL (ref 6–20)
CO2: 22 mmol/L (ref 22–32)
Calcium: 8.7 mg/dL — ABNORMAL LOW (ref 8.9–10.3)
Chloride: 107 mmol/L (ref 98–111)
Creatinine, Ser: 1.06 mg/dL (ref 0.61–1.24)
GFR, Estimated: >60 mL/min (ref >60)
Glucose, Bld: 121 mg/dL — ABNORMAL HIGH (ref 70–99)
Potassium: 3.7 mmol/L (ref 3.5–5.1)
Sodium: 139 mmol/L (ref 135–145)

## 2020-11-23 LAB — BPAM FFP
Blood Product Expiration Date: 202208182359
Blood Product Expiration Date: 202208242359
Blood Product Expiration Date: 202208242359
Blood Product Expiration Date: 202208282359
ISSUE DATE / TIME: 202208162244
ISSUE DATE / TIME: 202208162244
ISSUE DATE / TIME: 202208162254
ISSUE DATE / TIME: 202208162254
Unit Type and Rh: 6200
Unit Type and Rh: 6200
Unit Type and Rh: 6200
Unit Type and Rh: 6200

## 2020-11-23 LAB — MRSA NEXT GEN BY PCR, NASAL: MRSA by PCR Next Gen: NOT DETECTED

## 2020-11-23 LAB — TRIGLYCERIDES: Triglycerides: 128 mg/dL (ref <150)

## 2020-11-23 LAB — BLOOD PRODUCT ORDER (VERBAL) VERIFICATION

## 2020-11-23 LAB — MAGNESIUM: Magnesium: 1.8 mg/dL (ref 1.7–2.4)

## 2020-11-23 LAB — HIV ANTIBODY (ROUTINE TESTING W REFLEX): HIV Screen 4th Generation wRfx: NONREACTIVE

## 2020-11-23 LAB — PHOSPHORUS: Phosphorus: 3.7 mg/dL (ref 2.5–4.6)

## 2020-11-23 MED ORDER — CHLORHEXIDINE GLUCONATE 0.12% ORAL RINSE (MEDLINE KIT)
15.0000 mL | Freq: Two times a day (BID) | OROMUCOSAL | Status: DC
  Administered 2020-11-23 – 2020-12-07 (×30): 15 mL via OROMUCOSAL

## 2020-11-23 MED ORDER — IOHEXOL 240 MG/ML SOLN
50.0000 mL | Freq: Once | INTRAMUSCULAR | Status: AC | PRN
  Administered 2020-11-23: 12 mL via INTRA_ARTERIAL

## 2020-11-23 MED ORDER — THIAMINE HCL 100 MG/ML IJ SOLN
100.0000 mg | Freq: Every day | INTRAMUSCULAR | Status: DC
  Administered 2020-11-23 – 2020-11-29 (×6): 100 mg via INTRAVENOUS
  Filled 2020-11-23 (×7): qty 2

## 2020-11-23 MED ORDER — FOLIC ACID 1 MG PO TABS
1.0000 mg | ORAL_TABLET | Freq: Every day | ORAL | Status: DC
  Administered 2020-11-23: 1 mg
  Filled 2020-11-23: qty 1

## 2020-11-23 MED ORDER — ORAL CARE MOUTH RINSE
15.0000 mL | OROMUCOSAL | Status: DC
  Administered 2020-11-23 – 2020-12-07 (×143): 15 mL via OROMUCOSAL

## 2020-11-23 MED ORDER — DOCUSATE SODIUM 50 MG/5ML PO LIQD
100.0000 mg | Freq: Two times a day (BID) | ORAL | Status: DC
  Administered 2020-11-23: 100 mg
  Filled 2020-11-23: qty 10

## 2020-11-23 MED ORDER — IOHEXOL 300 MG/ML  SOLN
50.0000 mL | Freq: Once | INTRAMUSCULAR | Status: AC | PRN
  Administered 2020-11-23: 30 mL via INTRA_ARTERIAL

## 2020-11-23 MED ORDER — ACETAMINOPHEN 500 MG PO TABS
1000.0000 mg | ORAL_TABLET | Freq: Four times a day (QID) | ORAL | Status: DC
  Administered 2020-11-23: 1000 mg
  Filled 2020-11-23: qty 2

## 2020-11-23 MED ORDER — OXYCODONE HCL 5 MG PO TABS: 5.0000 mg | ORAL_TABLET | ORAL | Status: DC | PRN

## 2020-11-23 MED ORDER — CHLORHEXIDINE GLUCONATE CLOTH 2 % EX PADS
6.0000 | MEDICATED_PAD | Freq: Every day | CUTANEOUS | Status: DC
  Administered 2020-11-23 – 2020-12-03 (×11): 6 via TOPICAL

## 2020-11-23 MED ORDER — LORAZEPAM 1 MG PO TABS: 1.0000 mg | ORAL_TABLET | ORAL | Status: AC | PRN

## 2020-11-23 MED ORDER — THIAMINE HCL 100 MG PO TABS
100.0000 mg | ORAL_TABLET | Freq: Every day | ORAL | Status: DC
Start: 2020-11-23 — End: 2020-12-15
  Administered 2020-11-27 – 2020-12-15 (×17): 100 mg
  Filled 2020-11-23 (×18): qty 1

## 2020-11-23 MED ORDER — LORAZEPAM 2 MG/ML IJ SOLN
1.0000 mg | INTRAMUSCULAR | Status: AC | PRN
  Administered 2020-11-24 (×4): 2 mg via INTRAVENOUS
  Filled 2020-11-23: qty 2
  Filled 2020-11-23: qty 1

## 2020-11-23 MED ORDER — ADULT MULTIVITAMIN W/MINERALS CH
1.0000 | ORAL_TABLET | Freq: Every day | ORAL | Status: DC
  Administered 2020-11-23: 1
  Filled 2020-11-23: qty 1

## 2020-11-23 MED ORDER — LORAZEPAM 2 MG/ML IJ SOLN
0.0000 mg | Freq: Two times a day (BID) | INTRAMUSCULAR | Status: AC
Start: 2020-11-25 — End: 2020-11-27
  Administered 2020-11-25: 2 mg via INTRAVENOUS
  Filled 2020-11-23: qty 1

## 2020-11-23 MED ORDER — LORAZEPAM 2 MG/ML IJ SOLN
0.0000 mg | Freq: Four times a day (QID) | INTRAMUSCULAR | Status: AC
  Administered 2020-11-23 (×2): 2 mg via INTRAVENOUS
  Administered 2020-11-24: 4 mg via INTRAVENOUS
  Administered 2020-11-25: 2 mg via INTRAVENOUS
  Filled 2020-11-23: qty 1
  Filled 2020-11-23: qty 2
  Filled 2020-11-23 (×2): qty 1
  Filled 2020-11-23: qty 2
  Filled 2020-11-23: qty 1

## 2020-11-23 NOTE — Sedation Documentation (Signed)
No additional sedation needed. Patient is a on propofol and fentanyl gtt

## 2020-11-23 NOTE — Procedures (Signed)
   Procedure Note  Date: 11/23/2020  Procedure: central venous catheter placement--right, subclavian vein, without ultrasound guidance  Pre-op diagnosis: inadequate IV access, unable to obtain additional peripheral access Post-op diagnosis: same  Surgeon: Diamantina Monks, MD  Anesthesia: local  EBL: <5cc Drains/Implants: triple lumen central venous catheter  Description of procedure: Time-out was performed verifying correct patient, procedure, site, laterality. This procedure was performed emergently and therefore informed consent was not obtained. The right upper chest was prepped and draped in the usual sterile fashion. The right subclavian vein was accessed using an introducer needle and a guidewire passed through the needle. The needle was removed and a skin nick was made. The tract was dilated and the central venous catheter advanced over the guidewire followed by removal of the guidewire. All ports drew blood easily and all were flushed with saline. The catheter was secured to the skin with suture and a sterile dressing. The patient tolerated the procedure well. There were no immediate complications. Follow up chest x-ray was ordered to confirm positioning and the absence of a pneumothorax.   Diamantina Monks, MD General and Trauma Surgery [facility] Surgery

## 2020-11-23 NOTE — Progress Notes (Signed)
PT Cancellation Note  Patient Details Name: Benjamin Clements MRN: [id number] DOB: 04-05-1961   Cancelled Treatment:    Reason Eval/Treat Not Completed: Patient not medically ready. RN requesting hold on PT eval this date. Will follow-up another day as appropriate.   Raymond Gurney, PT, DPT Acute Rehabilitation Services  Pager: [phone] Office: [phone]    Jewel Baize 11/23/2020, 8:25 AM

## 2020-11-23 NOTE — Progress Notes (Signed)
Transition of Care [facility]) - CAGE-AID Screening   Patient Details  Name: Benjamin Clements MRN: [id number] Date of Birth: 1960/09/26   Clinical Narrative:  Pt currently intubated and unable to participate  CAGE-AID Screening: Substance Abuse Screening unable to be completed due to: : Patient unable to participate

## 2020-11-23 NOTE — ED Notes (Signed)
Dr. Bedelia Person made aware that patients BP increase to systolic 220s. TRN titrated propofol for his BP being elevated. Dr. Bedelia Person made aware and that his BP did come down to systolic 120s. Trauma provider stated that was okay.

## 2020-11-23 NOTE — Progress Notes (Signed)
[place], RN on 4N notified that as per Dr. Bedelia Person, no OG, but NG tube to be placed.
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
## 2020-11-23 NOTE — Progress Notes (Signed)
NG that was placed, was not seen per radiology.  NG tried to be replaced but unsuccessful due to pt thrashing.  OG was attempted to be placed X2, but also unsuccessful due to pt thrashing.    Dr. Janee Morn was called and notified and no new verbal orders were given.  RN will continue to monitor.  Vanessa Ralphs, RN

## 2020-11-23 NOTE — Progress Notes (Signed)
OT Cancellation Note  Patient Details Name: Benjamin Clements MRN: [id number] DOB: November 30, 1960   Cancelled Treatment:    Reason Eval/Treat Not Completed: Patient not medically ready  Thornell Mule, OT/L   Acute OT Clinical Specialist Acute Rehabilitation Services Pager [phone] Office [phone]  11/23/2020, 8:24 AM
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
## 2020-11-23 NOTE — ED Notes (Signed)
IR staff stated this TRN can leave now and they will transport pt to the floor. TRN will meet pt up on 4N

## 2020-11-24 ENCOUNTER — Inpatient Hospital Stay (HOSPITAL_COMMUNITY): Payer: Medicaid Other

## 2020-11-24 ENCOUNTER — Inpatient Hospital Stay (HOSPITAL_COMMUNITY): Payer: Medicaid Other | Admitting: Certified Registered Nurse Anesthetist

## 2020-11-24 LAB — POCT I-STAT 7, (LYTES, BLD GAS, ICA,H+H)
Acid-Base Excess: 1 mmol/L (ref 0.0–2.0)
Bicarbonate: 25.2 mmol/L (ref 20.0–28.0)
Calcium, Ion: 1.24 mmol/L (ref 1.15–1.40)
HCT: 25 % — ABNORMAL LOW (ref 39.0–52.0)
Hemoglobin: 8.5 g/dL — ABNORMAL LOW (ref 13.0–17.0)
O2 Saturation: 100 %
Patient temperature: 98.7
Potassium: 4.3 mmol/L (ref 3.5–5.1)
Sodium: 139 mmol/L (ref 135–145)
TCO2: 26 mmol/L (ref 22–32)
pCO2 arterial: 38.9 mmHg (ref 32.0–48.0)
pH, Arterial: 7.418 (ref 7.350–7.450)
pO2, Arterial: 173 mmHg — ABNORMAL HIGH (ref 83.0–108.0)

## 2020-11-24 LAB — CBC
HCT: 26.4 % — ABNORMAL LOW (ref 39.0–52.0)
HCT: 28.2 % — ABNORMAL LOW (ref 39.0–52.0)
HCT: 25.7 % — ABNORMAL LOW (ref 39.0–52.0)
Hemoglobin: 8.5 g/dL — ABNORMAL LOW (ref 13.0–17.0)
Hemoglobin: 9.1 g/dL — ABNORMAL LOW (ref 13.0–17.0)
Hemoglobin: 8.1 g/dL — ABNORMAL LOW (ref 13.0–17.0)
MCH: 30.9 pg (ref 26.0–34.0)
MCH: 31.3 pg (ref 26.0–34.0)
MCH: 30.8 pg (ref 26.0–34.0)
MCHC: 32.2 g/dL (ref 30.0–36.0)
MCHC: 32.3 g/dL (ref 30.0–36.0)
MCHC: 31.5 g/dL (ref 30.0–36.0)
MCV: 96 fL (ref 80.0–100.0)
MCV: 96.9 fL (ref 80.0–100.0)
MCV: 97.7 fL (ref 80.0–100.0)
Platelets: 112 10*3/uL — ABNORMAL LOW (ref 150–400)
Platelets: 110 10*3/uL — ABNORMAL LOW (ref 150–400)
Platelets: 101 10*3/uL — ABNORMAL LOW (ref 150–400)
RBC: 2.75 MIL/uL — ABNORMAL LOW (ref 4.22–5.81)
RBC: 2.91 MIL/uL — ABNORMAL LOW (ref 4.22–5.81)
RBC: 2.63 MIL/uL — ABNORMAL LOW (ref 4.22–5.81)
RDW: 18.6 % — ABNORMAL HIGH (ref 11.5–15.5)
RDW: 18.3 % — ABNORMAL HIGH (ref 11.5–15.5)
RDW: 17.9 % — ABNORMAL HIGH (ref 11.5–15.5)
WBC: 13.3 10*3/uL — ABNORMAL HIGH (ref 4.0–10.5)
WBC: 16.1 10*3/uL — ABNORMAL HIGH (ref 4.0–10.5)
WBC: 16.4 10*3/uL — ABNORMAL HIGH (ref 4.0–10.5)
nRBC: 0 % (ref 0.0–0.2)
nRBC: 0 % (ref 0.0–0.2)
nRBC: 0 % (ref 0.0–0.2)

## 2020-11-24 LAB — BASIC METABOLIC PANEL
Anion gap: 8 (ref 5–15)
BUN: 14 mg/dL (ref 6–20)
CO2: 25 mmol/L (ref 22–32)
Calcium: 8.7 mg/dL — ABNORMAL LOW (ref 8.9–10.3)
Chloride: 106 mmol/L (ref 98–111)
Creatinine, Ser: 1.04 mg/dL (ref 0.61–1.24)
GFR, Estimated: >60 mL/min (ref >60)
Glucose, Bld: 126 mg/dL — ABNORMAL HIGH (ref 70–99)
Potassium: 3.9 mmol/L (ref 3.5–5.1)
Sodium: 139 mmol/L (ref 135–145)

## 2020-11-24 MED ORDER — SUCCINYLCHOLINE CHLORIDE 200 MG/10ML IV SOSY
PREFILLED_SYRINGE | INTRAVENOUS | Status: DC | PRN
  Administered 2020-11-24: 120 mg via INTRAVENOUS

## 2020-11-24 MED ORDER — FENTANYL CITRATE (PF) 100 MCG/2ML IJ SOLN: 50.0000 ug | Freq: Once | INTRAMUSCULAR | Status: DC

## 2020-11-24 MED ORDER — ETOMIDATE 2 MG/ML IV SOLN
INTRAVENOUS | Status: DC | PRN
  Administered 2020-11-24: 14 mg via INTRAVENOUS

## 2020-11-24 MED ORDER — PROPOFOL 1000 MG/100ML IV EMUL
INTRAVENOUS | Status: AC
  Administered 2020-11-24: 30 ug/kg/min via INTRAVENOUS
  Filled 2020-11-24: qty 100

## 2020-11-24 MED ORDER — METOPROLOL TARTRATE 5 MG/5ML IV SOLN
5.0000 mg | INTRAVENOUS | Status: DC | PRN
  Administered 2020-11-25 – 2020-12-11 (×11): 5 mg via INTRAVENOUS
  Filled 2020-11-24 (×11): qty 5

## 2020-11-24 MED ORDER — OXYCODONE HCL 5 MG PO TABS
5.0000 mg | ORAL_TABLET | ORAL | Status: DC | PRN
  Administered 2020-11-24: 10 mg via ORAL
  Filled 2020-11-24: qty 2

## 2020-11-24 MED ORDER — BETHANECHOL CHLORIDE 10 MG PO TABS
25.0000 mg | ORAL_TABLET | Freq: Three times a day (TID) | ORAL | Status: DC
  Administered 2020-11-24 – 2020-11-29 (×11): 25 mg via ORAL
  Filled 2020-11-24 (×14): qty 3

## 2020-11-24 MED ORDER — FENTANYL BOLUS VIA INFUSION
50.0000 ug | INTRAVENOUS | Status: DC | PRN
  Administered 2020-11-26 – 2020-11-27 (×5): 100 ug via INTRAVENOUS
  Administered 2020-11-29 (×4): 50 ug via INTRAVENOUS
  Administered 2020-11-30 – 2020-12-01 (×2): 100 ug via INTRAVENOUS
  Administered 2020-12-01: 50 ug via INTRAVENOUS
  Administered 2020-12-01 (×5): 100 ug via INTRAVENOUS
  Filled 2020-11-24: qty 100

## 2020-11-24 MED ORDER — FENTANYL CITRATE (PF) 100 MCG/2ML IJ SOLN
50.0000 ug | INTRAMUSCULAR | Status: DC | PRN
  Administered 2020-11-24 (×3): 100 ug via INTRAVENOUS
  Filled 2020-11-24 (×3): qty 2

## 2020-11-24 MED ORDER — FOLIC ACID 1 MG PO TABS
1.0000 mg | ORAL_TABLET | Freq: Every day | ORAL | Status: DC
  Filled 2020-11-24 (×2): qty 1

## 2020-11-24 MED ORDER — PROPOFOL 1000 MG/100ML IV EMUL
0.0000 ug/kg/min | INTRAVENOUS | Status: DC
  Administered 2020-11-25: 10 ug/kg/min via INTRAVENOUS
  Administered 2020-11-26: 50 ug/kg/min via INTRAVENOUS
  Administered 2020-11-26: 40 ug/kg/min via INTRAVENOUS
  Administered 2020-11-26: 25 ug/kg/min via INTRAVENOUS
  Administered 2020-11-26: 35 ug/kg/min via INTRAVENOUS
  Administered 2020-11-27 – 2020-11-29 (×12): 50 ug/kg/min via INTRAVENOUS
  Administered 2020-11-29 (×2): 40 ug/kg/min via INTRAVENOUS
  Administered 2020-11-29 – 2020-11-30 (×2): 45 ug/kg/min via INTRAVENOUS
  Administered 2020-11-30: 40 ug/kg/min via INTRAVENOUS
  Administered 2020-11-30: 45 ug/kg/min via INTRAVENOUS
  Administered 2020-12-01: 30 ug/kg/min via INTRAVENOUS
  Administered 2020-12-01 (×3): 40 ug/kg/min via INTRAVENOUS
  Administered 2020-12-02 (×2): 30 ug/kg/min via INTRAVENOUS
  Administered 2020-12-02: 40 ug/kg/min via INTRAVENOUS
  Administered 2020-12-03: 25 ug/kg/min via INTRAVENOUS
  Administered 2020-12-03: 30 ug/kg/min via INTRAVENOUS
  Administered 2020-12-04: 35 ug/kg/min via INTRAVENOUS
  Administered 2020-12-04: 40 ug/kg/min via INTRAVENOUS
  Administered 2020-12-04: 35 ug/kg/min via INTRAVENOUS
  Administered 2020-12-04: 30 ug/kg/min via INTRAVENOUS
  Administered 2020-12-05 – 2020-12-06 (×4): 40 ug/kg/min via INTRAVENOUS
  Administered 2020-12-06: 30 ug/kg/min via INTRAVENOUS
  Administered 2020-12-06: 35 ug/kg/min via INTRAVENOUS
  Administered 2020-12-06 (×2): 40 ug/kg/min via INTRAVENOUS
  Administered 2020-12-07: 30 ug/kg/min via INTRAVENOUS
  Filled 2020-11-24 (×33): qty 100
  Filled 2020-11-24: qty 200
  Filled 2020-11-24 (×7): qty 100
  Filled 2020-11-24: qty 200
  Filled 2020-11-24 (×5): qty 100

## 2020-11-24 MED ORDER — FUROSEMIDE 10 MG/ML IJ SOLN
20.0000 mg | Freq: Once | INTRAMUSCULAR | Status: AC
  Administered 2020-11-24: 20 mg via INTRAVENOUS
  Filled 2020-11-24: qty 2

## 2020-11-24 MED ORDER — DEXMEDETOMIDINE HCL IN NACL 400 MCG/100ML IV SOLN
0.4000 ug/kg/h | INTRAVENOUS | Status: DC
  Administered 2020-11-24: 0.8 ug/kg/h via INTRAVENOUS
  Administered 2020-11-25: 0.4 ug/kg/h via INTRAVENOUS
  Administered 2020-11-25: 0.8 ug/kg/h via INTRAVENOUS
  Filled 2020-11-24 (×3): qty 100

## 2020-11-24 MED ORDER — FENTANYL 2500MCG IN NS 250ML (10MCG/ML) PREMIX INFUSION
0.0000 ug/h | INTRAVENOUS | Status: DC
  Administered 2020-11-24: 50 ug/h via INTRAVENOUS
  Administered 2020-11-25: 175 ug/h via INTRAVENOUS
  Administered 2020-11-26 – 2020-11-27 (×4): 200 ug/h via INTRAVENOUS
  Administered 2020-11-28: 350 ug/h via INTRAVENOUS
  Administered 2020-11-28 – 2020-11-30 (×8): 400 ug/h via INTRAVENOUS
  Administered 2020-11-30 – 2020-12-01 (×3): 350 ug/h via INTRAVENOUS
  Administered 2020-12-01: 175 ug/h via INTRAVENOUS
  Administered 2020-12-01: 300 ug/h via INTRAVENOUS
  Filled 2020-11-24 (×20): qty 250

## 2020-11-24 MED ORDER — HYDRALAZINE HCL 20 MG/ML IJ SOLN
10.0000 mg | Freq: Four times a day (QID) | INTRAMUSCULAR | Status: DC | PRN
  Administered 2020-11-24 – 2020-12-12 (×9): 10 mg via INTRAVENOUS
  Filled 2020-11-24 (×8): qty 1

## 2020-11-24 MED ORDER — METOPROLOL TARTRATE 5 MG/5ML IV SOLN
5.0000 mg | Freq: Four times a day (QID) | INTRAVENOUS | Status: DC | PRN
  Administered 2020-11-24: 5 mg via INTRAVENOUS
  Filled 2020-11-24: qty 5

## 2020-11-24 NOTE — Progress Notes (Signed)
PT Cancellation Note  Patient Details Name: Benjamin Clements MRN: [id number] DOB: 1960-06-14   Cancelled Treatment:    Reason Eval/Treat Not Completed: Patient not medically ready. RN reporting pt may be getting re-intubated. Will follow-up later as time permits and as appropriate.   Raymond Gurney, PT, DPT Acute Rehabilitation Services  Pager: [phone] Office: [phone]    Jewel Baize 11/24/2020, 8:21 AM
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
## 2020-11-24 NOTE — Progress Notes (Signed)
Dr. Janee Morn at bedside. Desaturation after extubation. O2 sat 82. IS performed with no improvement. Encouraging patient to cough secretions. Patient c/o pain in his abdomen. Patient tachycardic and hypertensive. See vitals. Order to bolus from fentanyl gtt.O2 sat improved to 94 after pain medication administered. HR 98.

## 2020-11-24 NOTE — Progress Notes (Signed)
Patient placed back on NRB due to desat. RN at bedside & MD notified.

## 2020-11-24 NOTE — Anesthesia Procedure Notes (Signed)
Procedure Name: Intubation Date/Time: 11/24/2020 5:50 PM Performed by: Tressia Miners, CRNA Pre-anesthesia Checklist: Patient identified, Emergency Drugs available, Suction available and Patient being monitored Patient Re-evaluated:Patient Re-evaluated prior to induction Oxygen Delivery Method: Ambu bag Preoxygenation: Pre-oxygenation with 100% oxygen Induction Type: IV induction, Rapid sequence and Cricoid Pressure applied Ventilation: Unable to mask ventilate Laryngoscope Size: Glidescope and 4 Grade View: Grade I Tube type: Oral Tube size: 7.5 mm Number of attempts: 1 Airway Equipment and Method: Video-laryngoscopy and Rigid stylet Placement Confirmation: ETT inserted through vocal cords under direct vision, positive ETCO2, breath sounds checked- equal and bilateral and CO2 detector Secured at: 23 cm Tube secured with: Tape Dental Injury: Teeth and Oropharynx as per pre-operative assessment  Comments: Pt with poor dentition, remains the same as pre-assessment

## 2020-11-24 NOTE — Procedures (Signed)
Extubation Procedure Note  Patient Details:   Name: Benjamin Clements DOB: [birth year] MRN: [id number]   Airway Documentation:    Vent end date: 11/24/20 Vent end time: 0807   Evaluation  O2 sats: transiently fell during during procedure Complications: No apparent complications Patient did tolerate procedure well. Bilateral Breath Sounds: Rhonchi   Yes  Positive cuff leak noted. Patient placed on Bear 4L with humidity, no stridor noted. Patient's sats dropped to 82%, liter flow increased to 6, sats continued to drop, NRB mask placed on patient with 15L O2. Sats improved to 92%. Patient encouraged to cough out secretions and take slow deep breathes. RT attempted IS with patient and he was able to reach 375 mL. Patient is complaining of pain in his stomach and rib area.    Rayburn Felt 11/24/2020, 8:44 AM

## 2020-11-24 NOTE — Progress Notes (Signed)
Paged Dr. Janee Morn regarding respiratory distress, tachycardia, tachypnea, hypertension. See vitals. MD to bedside. RT at bedside. Ativan, fentanyl, and precedex administered for CIWA. He did not respond to interventions. Anesthesia called to bedside to re-intubate.

## 2020-11-24 NOTE — Progress Notes (Signed)
OT Cancellation Note  Patient Details Name: Benjamin Clements MRN: [id number] DOB: 02/20/1961   Cancelled Treatment:    Reason Eval/Treat Not Completed: Medical issues which prohibited therapy. RN reporting pt may be getting re-intubated. Will follow-up as medically ready and schedule allows. Thank you.  Lerae Langham M Rikia Sukhu Makaylen Thieme MSOT, OTR/L Acute Rehab Pager: [phone] Office: [phone] 11/24/2020, 8:39 AM
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
## 2020-11-25 ENCOUNTER — Inpatient Hospital Stay (HOSPITAL_COMMUNITY): Payer: Medicaid Other

## 2020-11-25 LAB — CBC
HCT: 23.8 % — ABNORMAL LOW (ref 39.0–52.0)
Hemoglobin: 7.6 g/dL — ABNORMAL LOW (ref 13.0–17.0)
MCH: 31.3 pg (ref 26.0–34.0)
MCHC: 31.9 g/dL (ref 30.0–36.0)
MCV: 97.9 fL (ref 80.0–100.0)
Platelets: 95 10*3/uL — ABNORMAL LOW (ref 150–400)
RBC: 2.43 MIL/uL — ABNORMAL LOW (ref 4.22–5.81)
RDW: 17.3 % — ABNORMAL HIGH (ref 11.5–15.5)
WBC: 12 10*3/uL — ABNORMAL HIGH (ref 4.0–10.5)
nRBC: 0 % (ref 0.0–0.2)

## 2020-11-25 LAB — BASIC METABOLIC PANEL
Anion gap: 5 (ref 5–15)
BUN: 17 mg/dL (ref 6–20)
CO2: 25 mmol/L (ref 22–32)
Calcium: 8.7 mg/dL — ABNORMAL LOW (ref 8.9–10.3)
Chloride: 106 mmol/L (ref 98–111)
Creatinine, Ser: 1.19 mg/dL (ref 0.61–1.24)
GFR, Estimated: >60 mL/min (ref >60)
Glucose, Bld: 129 mg/dL — ABNORMAL HIGH (ref 70–99)
Potassium: 4.1 mmol/L (ref 3.5–5.1)
Sodium: 136 mmol/L (ref 135–145)

## 2020-11-25 LAB — TRIGLYCERIDES: Triglycerides: 69 mg/dL (ref <150)

## 2020-11-25 MED ORDER — LEVETIRACETAM IN NACL 500 MG/100ML IV SOLN
500.0000 mg | Freq: Two times a day (BID) | INTRAVENOUS | Status: DC
  Administered 2020-11-25 – 2020-11-29 (×8): 500 mg via INTRAVENOUS
  Filled 2020-11-25 (×8): qty 100

## 2020-11-25 MED ORDER — ENOXAPARIN SODIUM 30 MG/0.3ML IJ SOSY
30.0000 mg | PREFILLED_SYRINGE | Freq: Two times a day (BID) | INTRAMUSCULAR | Status: DC
  Administered 2020-11-26 – 2020-12-19 (×47): 30 mg via SUBCUTANEOUS
  Filled 2020-11-25 (×47): qty 0.3

## 2020-11-25 MED ORDER — OXYCODONE HCL 5 MG PO TABS
5.0000 mg | ORAL_TABLET | ORAL | Status: DC | PRN
  Administered 2020-11-27 – 2020-12-01 (×10): 10 mg
  Administered 2020-12-02: 5 mg
  Administered 2020-12-02: 10 mg
  Administered 2020-12-02: 5 mg
  Administered 2020-12-03 – 2020-12-08 (×13): 10 mg
  Administered 2020-12-09: 5 mg
  Administered 2020-12-11 – 2020-12-13 (×9): 10 mg
  Administered 2020-12-13: 5 mg
  Administered 2020-12-14 (×2): 10 mg
  Filled 2020-11-25 (×7): qty 2
  Filled 2020-11-25: qty 1
  Filled 2020-11-25 (×6): qty 2
  Filled 2020-11-25: qty 1
  Filled 2020-11-25 (×6): qty 2
  Filled 2020-11-25: qty 1
  Filled 2020-11-25 (×18): qty 2

## 2020-11-25 MED ORDER — ACETAMINOPHEN 500 MG PO TABS
1000.0000 mg | ORAL_TABLET | Freq: Four times a day (QID) | ORAL | Status: AC | PRN
  Administered 2020-11-25 – 2020-12-09 (×5): 1000 mg
  Filled 2020-11-25 (×5): qty 2

## 2020-11-25 MED ORDER — FOLIC ACID 5 MG/ML IJ SOLN
1.0000 mg | Freq: Every day | INTRAMUSCULAR | Status: DC
  Administered 2020-11-25 – 2020-11-29 (×5): 1 mg via INTRAVENOUS
  Filled 2020-11-25 (×6): qty 0.2
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
## 2020-11-25 NOTE — Progress Notes (Signed)
PT Cancellation Note  Patient Details Name: Benjamin Clements MRN: [id number] DOB: June 03, 1960   Cancelled Treatment:    Reason Eval/Treat Not Completed: Patient not medically ready;Active bedrest order. Pt re-intubated yesterday evening. RN requesting hold on PT treatment at this time. Will plan to follow-up another day as appropriate.   Raymond Gurney, PT, DPT Acute Rehabilitation Services  Pager: [phone] Office: [phone]    Jewel Baize 11/25/2020, 7:47 AM

## 2020-11-26 LAB — BASIC METABOLIC PANEL
Anion gap: 10 (ref 5–15)
BUN: 24 mg/dL — ABNORMAL HIGH (ref 6–20)
CO2: 23 mmol/L (ref 22–32)
Calcium: 8.9 mg/dL (ref 8.9–10.3)
Chloride: 105 mmol/L (ref 98–111)
Creatinine, Ser: 1.4 mg/dL — ABNORMAL HIGH (ref 0.61–1.24)
GFR, Estimated: 58 mL/min — ABNORMAL LOW (ref >60)
Glucose, Bld: 129 mg/dL — ABNORMAL HIGH (ref 70–99)
Potassium: 4.2 mmol/L (ref 3.5–5.1)
Sodium: 138 mmol/L (ref 135–145)

## 2020-11-26 LAB — CBC
HCT: 22.3 % — ABNORMAL LOW (ref 39.0–52.0)
Hemoglobin: 7.1 g/dL — ABNORMAL LOW (ref 13.0–17.0)
MCH: 31.4 pg (ref 26.0–34.0)
MCHC: 31.8 g/dL (ref 30.0–36.0)
MCV: 98.7 fL (ref 80.0–100.0)
Platelets: 124 10*3/uL — ABNORMAL LOW (ref 150–400)
RBC: 2.26 MIL/uL — ABNORMAL LOW (ref 4.22–5.81)
RDW: 17.1 % — ABNORMAL HIGH (ref 11.5–15.5)
WBC: 15 10*3/uL — ABNORMAL HIGH (ref 4.0–10.5)
nRBC: 0 % (ref 0.0–0.2)

## 2020-11-26 MED ORDER — ENOXAPARIN SODIUM 40 MG/0.4ML IJ SOSY: 40.0000 mg | PREFILLED_SYRINGE | INTRAMUSCULAR | Status: DC
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
## 2020-11-26 NOTE — Plan of Care (Signed)
  Problem: Safety: Goal: Non-violent Restraint(s) Outcome: Progressing   Problem: Activity: Goal: Ability to tolerate increased activity will improve Outcome: Progressing   Problem: Respiratory: Goal: Ability to maintain a clear airway and adequate ventilation will improve Outcome: Progressing   Problem: Role Relationship: Goal: Method of communication will improve Outcome: Progressing   

## 2020-11-27 LAB — CBC
HCT: 19.8 % — ABNORMAL LOW (ref 39.0–52.0)
Hemoglobin: 6.1 g/dL — CL (ref 13.0–17.0)
MCH: 31 pg (ref 26.0–34.0)
MCHC: 30.8 g/dL (ref 30.0–36.0)
MCV: 100.5 fL — ABNORMAL HIGH (ref 80.0–100.0)
Platelets: 141 10*3/uL — ABNORMAL LOW (ref 150–400)
RBC: 1.97 MIL/uL — ABNORMAL LOW (ref 4.22–5.81)
RDW: 16.5 % — ABNORMAL HIGH (ref 11.5–15.5)
WBC: 12 10*3/uL — ABNORMAL HIGH (ref 4.0–10.5)
nRBC: 0 % (ref 0.0–0.2)

## 2020-11-27 LAB — BASIC METABOLIC PANEL
Anion gap: 7 (ref 5–15)
BUN: 18 mg/dL (ref 6–20)
CO2: 23 mmol/L (ref 22–32)
Calcium: 8.3 mg/dL — ABNORMAL LOW (ref 8.9–10.3)
Chloride: 106 mmol/L (ref 98–111)
Creatinine, Ser: 1.2 mg/dL (ref 0.61–1.24)
GFR, Estimated: >60 mL/min (ref >60)
Glucose, Bld: 115 mg/dL — ABNORMAL HIGH (ref 70–99)
Potassium: 3.7 mmol/L (ref 3.5–5.1)
Sodium: 136 mmol/L (ref 135–145)

## 2020-11-27 LAB — CULTURE, RESPIRATORY W GRAM STAIN: Culture: NORMAL

## 2020-11-27 LAB — PREPARE RBC (CROSSMATCH)

## 2020-11-27 LAB — GLUCOSE, CAPILLARY: Glucose-Capillary: 130 mg/dL — ABNORMAL HIGH (ref 70–99)

## 2020-11-27 MED ORDER — SODIUM CHLORIDE 0.9% IV SOLUTION: Freq: Once | INTRAVENOUS | Status: DC

## 2020-11-27 MED ORDER — BISACODYL 10 MG RE SUPP
10.0000 mg | Freq: Every day | RECTAL | Status: DC
  Administered 2020-11-27 – 2020-12-19 (×17): 10 mg via RECTAL
  Filled 2020-11-27 (×17): qty 1

## 2020-11-27 MED ORDER — SODIUM CHLORIDE 0.9 % IV SOLN: INTRAVENOUS | Status: DC | PRN

## 2020-11-27 MED ORDER — GUAIFENESIN 100 MG/5ML PO SOLN
10.0000 mL | ORAL | Status: DC
  Administered 2020-11-27 – 2020-11-29 (×9): 200 mg
  Filled 2020-11-27 (×8): qty 15

## 2020-11-27 MED ORDER — MIDAZOLAM HCL 2 MG/2ML IJ SOLN: 2.0000 mg | Freq: Once | INTRAMUSCULAR | Status: AC

## 2020-11-27 MED ORDER — VITAL HIGH PROTEIN PO LIQD
1000.0000 mL | ORAL | Status: DC
  Administered 2020-11-27 – 2020-11-28 (×3): 1000 mL

## 2020-11-27 MED ORDER — MIDAZOLAM HCL 2 MG/2ML IJ SOLN
INTRAMUSCULAR | Status: AC
  Administered 2020-11-27: 2 mg via INTRAVENOUS
  Filled 2020-11-27: qty 2

## 2020-11-27 NOTE — Plan of Care (Signed)
  Problem: Safety: Goal: Non-violent Restraint(s) Outcome: Progressing   Problem: Activity: Goal: Ability to tolerate increased activity will improve Outcome: Progressing   Problem: Respiratory: Goal: Ability to maintain a clear airway and adequate ventilation will improve Outcome: Progressing   Problem: Role Relationship: Goal: Method of communication will improve Outcome: Progressing

## 2020-11-27 NOTE — Progress Notes (Signed)
Pt BP 185/102 Hydralazine 10 mg given with no resolve. Pt is restless, tremors have increased and  RR are 31. Dr. Bedelia Person notified. 2mg  versed, once ordered. Will notify MD if pt does not improve.   11/27/20 2242  Vitals  BP (!) 185/102  MAP (mmHg) 123  Pulse Rate (!) 107  ECG Heart Rate (!) 107  Resp (!) 24  Oxygen Therapy  SpO2 93 %  MEWS Score  MEWS Temp 0  MEWS Systolic 0  MEWS Pulse 1  MEWS RR 1  MEWS LOC 2  MEWS Score 4  MEWS Score Color Red
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
## 2020-11-28 ENCOUNTER — Inpatient Hospital Stay (HOSPITAL_COMMUNITY): Payer: Medicaid Other

## 2020-11-28 LAB — POCT I-STAT 7, (LYTES, BLD GAS, ICA,H+H)
Acid-base deficit: 3 mmol/L — ABNORMAL HIGH (ref 0.0–2.0)
Bicarbonate: 21.8 mmol/L (ref 20.0–28.0)
Calcium, Ion: 1.26 mmol/L (ref 1.15–1.40)
HCT: 25 % — ABNORMAL LOW (ref 39.0–52.0)
Hemoglobin: 8.5 g/dL — ABNORMAL LOW (ref 13.0–17.0)
O2 Saturation: 94 %
Patient temperature: 99
Potassium: 3.7 mmol/L (ref 3.5–5.1)
Sodium: 140 mmol/L (ref 135–145)
TCO2: 23 mmol/L (ref 22–32)
pCO2 arterial: 38.1 mmHg (ref 32.0–48.0)
pH, Arterial: 7.368 (ref 7.350–7.450)
pO2, Arterial: 74 mmHg — ABNORMAL LOW (ref 83.0–108.0)

## 2020-11-28 LAB — TYPE AND SCREEN
ABO/RH(D): O POS
Antibody Screen: NEGATIVE
Unit division: 0

## 2020-11-28 LAB — BASIC METABOLIC PANEL
Anion gap: 9 (ref 5–15)
BUN: 13 mg/dL (ref 6–20)
CO2: 21 mmol/L — ABNORMAL LOW (ref 22–32)
Calcium: 8.3 mg/dL — ABNORMAL LOW (ref 8.9–10.3)
Chloride: 106 mmol/L (ref 98–111)
Creatinine, Ser: 1.07 mg/dL (ref 0.61–1.24)
GFR, Estimated: >60 mL/min (ref >60)
Glucose, Bld: 136 mg/dL — ABNORMAL HIGH (ref 70–99)
Potassium: 3.8 mmol/L (ref 3.5–5.1)
Sodium: 136 mmol/L (ref 135–145)

## 2020-11-28 LAB — CBC
HCT: 26.9 % — ABNORMAL LOW (ref 39.0–52.0)
Hemoglobin: 8.7 g/dL — ABNORMAL LOW (ref 13.0–17.0)
MCH: 31.3 pg (ref 26.0–34.0)
MCHC: 32.3 g/dL (ref 30.0–36.0)
MCV: 96.8 fL (ref 80.0–100.0)
Platelets: 187 10*3/uL (ref 150–400)
RBC: 2.78 MIL/uL — ABNORMAL LOW (ref 4.22–5.81)
RDW: 16.8 % — ABNORMAL HIGH (ref 11.5–15.5)
WBC: 15.1 10*3/uL — ABNORMAL HIGH (ref 4.0–10.5)
nRBC: 0.1 % (ref 0.0–0.2)

## 2020-11-28 LAB — BPAM RBC
Blood Product Expiration Date: 202209202359
ISSUE DATE / TIME: 202208210719
Unit Type and Rh: 5100

## 2020-11-28 LAB — GLUCOSE, CAPILLARY: Glucose-Capillary: 144 mg/dL — ABNORMAL HIGH (ref 70–99)

## 2020-11-28 LAB — TRIGLYCERIDES: Triglycerides: 296 mg/dL — ABNORMAL HIGH (ref <150)

## 2020-11-28 MED ORDER — FUROSEMIDE 10 MG/ML IJ SOLN
20.0000 mg | Freq: Once | INTRAMUSCULAR | Status: AC
  Administered 2020-11-28: 20 mg via INTRAVENOUS
  Filled 2020-11-28: qty 2

## 2020-11-28 MED ORDER — CHLORDIAZEPOXIDE HCL 5 MG PO CAPS
5.0000 mg | ORAL_CAPSULE | Freq: Three times a day (TID) | ORAL | Status: DC
  Administered 2020-11-28 – 2020-11-30 (×6): 5 mg via ORAL
  Filled 2020-11-28 (×6): qty 1

## 2020-11-28 MED ORDER — IOHEXOL 350 MG/ML SOLN
100.0000 mL | Freq: Once | INTRAVENOUS | Status: AC | PRN
  Administered 2020-11-28: 100 mL via INTRAVENOUS

## 2020-11-28 MED ORDER — MIDAZOLAM HCL 2 MG/2ML IJ SOLN: 4.0000 mg | Freq: Once | INTRAMUSCULAR | Status: AC

## 2020-11-28 MED ORDER — MIDAZOLAM HCL 2 MG/2ML IJ SOLN
INTRAMUSCULAR | Status: AC
  Administered 2020-11-28: 4 mg via INTRAVENOUS
  Filled 2020-11-28: qty 4
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
## 2020-11-28 NOTE — Procedures (Signed)
Cortrak  Person Inserting Tube:  Benjamin Clements, Benjamin Clements, RD Tube Type:  Cortrak - 43 inches Tube Size:  10 Tube Location:  Right nare Initial Placement:  Stomach Secured by: Bridle Technique Used to Measure Tube Placement:  Marking at nare/corner of mouth Cortrak Secured At:  69 cm  Cortrak Tube Team Note:  Consult received to place a Cortrak feeding tube.   X-ray is required, abdominal x-ray has been ordered by the Cortrak team. Please confirm tube placement before using the Cortrak tube.   If the tube becomes dislodged please keep the tube and contact the Cortrak team at [email or web address] ([email or web address]) for replacement.  If after hours and replacement cannot be delayed, place a NG tube and confirm placement with an abdominal x-ray.    Benjamin Gavia, MS, RD, LDN (she/her/hers) RD pager number and weekend/on-call pager number located in Amion.

## 2020-11-28 NOTE — Procedures (Addendum)
Patient seen and examined. Large cuff leak with loss of >75% of volumes. Sats normal. Patient extubated and re-intubated under glidescope visualization. 4mg  versed given as pre-medication in addition to continuous sedation/analgesia. Single attempt, size 3 blade, sats maintained throughout, 25 at the teeth, colorimeter with color change, appropriate return TV post-procedure. Mild airway edema on visualization. Post-procedure CXR obtained.   , MD General and Trauma Surgery [facility]

## 2020-11-28 NOTE — Progress Notes (Signed)
   11/28/20 0807  Airway 7.5 mm  Placement Date/Time: 11/28/20 0510   Placed By: ICU physician  Airway Device: Endotracheal Tube  Laryngoscope Blade: MAC;3  ETT Types: Oral  Size (mm): 7.5 mm  Cuffed: Cuffed  Insertion attempts: 1  Airway Equipment: Video Laryngoscope;Stylet  Placeme...  Secured at (cm) 25 cm  Measured From Lips  Secured Location Left  Secured By Actuary Repositioned Yes  Prone position No  Cuff Pressure (cm H2O) Green OR 18-26 CmH2O  Site Condition Dry  Adult Ventilator Settings  Vent Type Servo i  Humidity HME  Vent Mode PRVC  Vt Set 510 mL  Set Rate 18 bmp  FiO2 (%) 50 %  I Time 0.8 Sec(s)  PEEP 10 cmH20  Adult Ventilator Measurements  Peak Airway Pressure 30 L/min  Mean Airway Pressure 16 cmH20  Plateau Pressure 12 cmH20  Resp Rate Spontaneous 2 br/min  Resp Rate Total 20 br/min  Exhaled Vt 643 mL  Measured Ve 9.7 mL  I:E Ratio Measured 1:3.1  Auto PEEP 0 cmH20  Total PEEP 10 cmH20  SpO2 94 %  Adult Ventilator Alarms  Alarms On Y  Ve High Alarm 18 L/min  Ve Low Alarm 5 L/min  Resp Rate High Alarm 40 br/min  Resp Rate Low Alarm 14  PEEP Low Alarm 8 cmH2O  Press High Alarm 40 cmH2O  VAP Prevention  HME changed No  HOB> 30 Degrees Y  Equipment wiped down Yes  Daily Weaning Assessment  Daily Assessment of Readiness to Wean Wean protocol criteria not met  Reason not met PEEP > 8  Breath Sounds  Bilateral Breath Sounds Diminished  R Upper  Breath Sounds Diminished  Airway Suctioning/Secretions  Suction Type ETT  Suction Device  Catheter  Secretion Amount Small  Secretion Color Clear;Tan  Secretion Consistency Thick  Suction Tolerance Tolerated well  Suctioning Adverse Effects None
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
## 2020-11-29 LAB — BASIC METABOLIC PANEL
Anion gap: 7 (ref 5–15)
BUN: 14 mg/dL (ref 6–20)
CO2: 24 mmol/L (ref 22–32)
Calcium: 8.4 mg/dL — ABNORMAL LOW (ref 8.9–10.3)
Chloride: 106 mmol/L (ref 98–111)
Creatinine, Ser: 0.97 mg/dL (ref 0.61–1.24)
GFR, Estimated: >60 mL/min (ref >60)
Glucose, Bld: 141 mg/dL — ABNORMAL HIGH (ref 70–99)
Potassium: 3.6 mmol/L (ref 3.5–5.1)
Sodium: 137 mmol/L (ref 135–145)

## 2020-11-29 LAB — CBC
HCT: 26.1 % — ABNORMAL LOW (ref 39.0–52.0)
Hemoglobin: 8.4 g/dL — ABNORMAL LOW (ref 13.0–17.0)
MCH: 31.1 pg (ref 26.0–34.0)
MCHC: 32.2 g/dL (ref 30.0–36.0)
MCV: 96.7 fL (ref 80.0–100.0)
Platelets: 237 10*3/uL (ref 150–400)
RBC: 2.7 MIL/uL — ABNORMAL LOW (ref 4.22–5.81)
RDW: 16.5 % — ABNORMAL HIGH (ref 11.5–15.5)
WBC: 13.6 10*3/uL — ABNORMAL HIGH (ref 4.0–10.5)
nRBC: 0 % (ref 0.0–0.2)

## 2020-11-29 LAB — GLUCOSE, CAPILLARY
Glucose-Capillary: 141 mg/dL — ABNORMAL HIGH (ref 70–99)
Glucose-Capillary: 152 mg/dL — ABNORMAL HIGH (ref 70–99)
Glucose-Capillary: 148 mg/dL — ABNORMAL HIGH (ref 70–99)
Glucose-Capillary: 155 mg/dL — ABNORMAL HIGH (ref 70–99)
Glucose-Capillary: 129 mg/dL — ABNORMAL HIGH (ref 70–99)

## 2020-11-29 MED ORDER — BETHANECHOL CHLORIDE 10 MG PO TABS
50.0000 mg | ORAL_TABLET | Freq: Three times a day (TID) | ORAL | Status: DC
  Administered 2020-11-29 – 2020-11-30 (×3): 50 mg via ORAL
  Filled 2020-11-29 (×3): qty 5

## 2020-11-29 MED ORDER — GUAIFENESIN 100 MG/5ML PO SOLN
15.0000 mL | ORAL | Status: DC
  Administered 2020-11-29 – 2020-12-14 (×90): 300 mg
  Filled 2020-11-29: qty 15
  Filled 2020-11-29: qty 20
  Filled 2020-11-29 (×3): qty 15
  Filled 2020-11-29: qty 10
  Filled 2020-11-29 (×2): qty 15
  Filled 2020-11-29: qty 20
  Filled 2020-11-29 (×8): qty 15
  Filled 2020-11-29: qty 20
  Filled 2020-11-29: qty 15
  Filled 2020-11-29 (×2): qty 20
  Filled 2020-11-29: qty 15
  Filled 2020-11-29 (×2): qty 20
  Filled 2020-11-29: qty 15
  Filled 2020-11-29: qty 10
  Filled 2020-11-29 (×6): qty 15
  Filled 2020-11-29: qty 30
  Filled 2020-11-29 (×2): qty 15
  Filled 2020-11-29: qty 20
  Filled 2020-11-29: qty 15
  Filled 2020-11-29: qty 20
  Filled 2020-11-29: qty 15
  Filled 2020-11-29: qty 10
  Filled 2020-11-29 (×3): qty 15
  Filled 2020-11-29 (×2): qty 20
  Filled 2020-11-29 (×2): qty 15
  Filled 2020-11-29: qty 20
  Filled 2020-11-29: qty 15
  Filled 2020-11-29 (×2): qty 20
  Filled 2020-11-29: qty 15
  Filled 2020-11-29: qty 20
  Filled 2020-11-29: qty 15
  Filled 2020-11-29 (×2): qty 20
  Filled 2020-11-29: qty 15
  Filled 2020-11-29 (×2): qty 20
  Filled 2020-11-29 (×2): qty 15
  Filled 2020-11-29 (×2): qty 20
  Filled 2020-11-29 (×3): qty 15
  Filled 2020-11-29: qty 20
  Filled 2020-11-29: qty 15
  Filled 2020-11-29: qty 20
  Filled 2020-11-29 (×4): qty 15
  Filled 2020-11-29: qty 20
  Filled 2020-11-29 (×2): qty 15
  Filled 2020-11-29: qty 20
  Filled 2020-11-29: qty 15
  Filled 2020-11-29 (×3): qty 20
  Filled 2020-11-29 (×6): qty 15
  Filled 2020-11-29: qty 20
  Filled 2020-11-29: qty 15

## 2020-11-29 MED ORDER — SENNA 8.6 MG PO TABS
1.0000 | ORAL_TABLET | Freq: Every day | ORAL | Status: DC
  Administered 2020-11-29 – 2020-12-15 (×15): 8.6 mg
  Filled 2020-11-29 (×17): qty 1

## 2020-11-29 MED ORDER — LEVETIRACETAM IN NACL 500 MG/100ML IV SOLN
500.0000 mg | Freq: Two times a day (BID) | INTRAVENOUS | Status: DC
  Administered 2020-11-29 – 2020-11-30 (×2): 500 mg via INTRAVENOUS
  Filled 2020-11-29 (×2): qty 100

## 2020-11-29 MED ORDER — PIVOT 1.5 CAL PO LIQD
1000.0000 mL | ORAL | Status: DC
  Administered 2020-11-29 – 2020-12-13 (×16): 1000 mL
  Filled 2020-11-29 (×9): qty 1000

## 2020-11-29 MED ORDER — POLYETHYLENE GLYCOL 3350 17 G PO PACK
17.0000 g | PACK | Freq: Every day | ORAL | Status: DC
  Administered 2020-11-29 – 2020-11-30 (×2): 17 g
  Filled 2020-11-29 (×2): qty 1

## 2020-11-29 NOTE — Progress Notes (Signed)
No void since foley removal at 1300. Bladder scanned and result 242 cc.
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
## 2020-11-29 NOTE — Progress Notes (Signed)
After cleaning patient from bowel movement patient desated and stayed in low to mid 80's. Suctioned and 100 % O2 breaths given several times. Sabino Snipes, RT who adjusted vent settings to 10 of peep and 60% fio2.

## 2020-11-30 LAB — CBC
HCT: 26.7 % — ABNORMAL LOW (ref 39.0–52.0)
Hemoglobin: 8.4 g/dL — ABNORMAL LOW (ref 13.0–17.0)
MCH: 30.8 pg (ref 26.0–34.0)
MCHC: 31.5 g/dL (ref 30.0–36.0)
MCV: 97.8 fL (ref 80.0–100.0)
Platelets: 322 10*3/uL (ref 150–400)
RBC: 2.73 MIL/uL — ABNORMAL LOW (ref 4.22–5.81)
RDW: 16.2 % — ABNORMAL HIGH (ref 11.5–15.5)
WBC: 12.3 10*3/uL — ABNORMAL HIGH (ref 4.0–10.5)
nRBC: 0 % (ref 0.0–0.2)

## 2020-11-30 LAB — CULTURE, RESPIRATORY W GRAM STAIN

## 2020-11-30 LAB — BASIC METABOLIC PANEL
Anion gap: 5 (ref 5–15)
BUN: 17 mg/dL (ref 6–20)
CO2: 25 mmol/L (ref 22–32)
Calcium: 8.3 mg/dL — ABNORMAL LOW (ref 8.9–10.3)
Chloride: 105 mmol/L (ref 98–111)
Creatinine, Ser: 0.92 mg/dL (ref 0.61–1.24)
GFR, Estimated: >60 mL/min (ref >60)
Glucose, Bld: 190 mg/dL — ABNORMAL HIGH (ref 70–99)
Potassium: 3.6 mmol/L (ref 3.5–5.1)
Sodium: 135 mmol/L (ref 135–145)

## 2020-11-30 LAB — GLUCOSE, CAPILLARY
Glucose-Capillary: 153 mg/dL — ABNORMAL HIGH (ref 70–99)
Glucose-Capillary: 179 mg/dL — ABNORMAL HIGH (ref 70–99)
Glucose-Capillary: 185 mg/dL — ABNORMAL HIGH (ref 70–99)
Glucose-Capillary: 193 mg/dL — ABNORMAL HIGH (ref 70–99)
Glucose-Capillary: 206 mg/dL — ABNORMAL HIGH (ref 70–99)
Glucose-Capillary: 223 mg/dL — ABNORMAL HIGH (ref 70–99)

## 2020-11-30 LAB — HEMOGLOBIN A1C
Hgb A1c MFr Bld: 6 % — ABNORMAL HIGH (ref 4.8–5.6)
Mean Plasma Glucose: 125.5 mg/dL

## 2020-11-30 MED ORDER — LEVETIRACETAM 100 MG/ML PO SOLN
500.0000 mg | Freq: Two times a day (BID) | ORAL | Status: AC
  Administered 2020-11-30 – 2020-12-02 (×4): 500 mg
  Filled 2020-11-30 (×4): qty 5

## 2020-11-30 MED ORDER — PANTOPRAZOLE SODIUM 40 MG PO PACK
40.0000 mg | PACK | Freq: Every day | ORAL | Status: DC
  Administered 2020-12-01 – 2020-12-07 (×7): 40 mg
  Filled 2020-11-30 (×7): qty 20

## 2020-11-30 MED ORDER — BETHANECHOL CHLORIDE 25 MG PO TABS
50.0000 mg | ORAL_TABLET | Freq: Three times a day (TID) | ORAL | Status: DC
  Administered 2020-11-30 – 2020-12-15 (×43): 50 mg
  Filled 2020-11-30 (×2): qty 2
  Filled 2020-11-30: qty 10
  Filled 2020-11-30 (×16): qty 2
  Filled 2020-11-30: qty 10
  Filled 2020-11-30 (×2): qty 2
  Filled 2020-11-30: qty 10
  Filled 2020-11-30 (×22): qty 2

## 2020-11-30 MED ORDER — FUROSEMIDE 10 MG/ML IJ SOLN
60.0000 mg | Freq: Once | INTRAMUSCULAR | Status: AC
  Administered 2020-11-30: 60 mg via INTRAVENOUS
  Filled 2020-11-30: qty 6

## 2020-11-30 MED ORDER — INSULIN ASPART 100 UNIT/ML IJ SOLN
0.0000 [IU] | INTRAMUSCULAR | Status: DC
  Administered 2020-11-30 (×2): 3 [IU] via SUBCUTANEOUS
  Administered 2020-11-30 – 2020-12-01 (×3): 2 [IU] via SUBCUTANEOUS
  Administered 2020-12-01: 5 [IU] via SUBCUTANEOUS
  Administered 2020-12-01: 2 [IU] via SUBCUTANEOUS
  Administered 2020-12-01: 3 [IU] via SUBCUTANEOUS
  Administered 2020-12-01 – 2020-12-02 (×5): 5 [IU] via SUBCUTANEOUS
  Administered 2020-12-02: 7 [IU] via SUBCUTANEOUS
  Administered 2020-12-02: 3 [IU] via SUBCUTANEOUS
  Administered 2020-12-02 (×2): 5 [IU] via SUBCUTANEOUS
  Administered 2020-12-03: 2 [IU] via SUBCUTANEOUS
  Administered 2020-12-03: 3 [IU] via SUBCUTANEOUS
  Administered 2020-12-03 – 2020-12-04 (×4): 2 [IU] via SUBCUTANEOUS
  Administered 2020-12-04 (×3): 3 [IU] via SUBCUTANEOUS
  Administered 2020-12-04: 2 [IU] via SUBCUTANEOUS
  Administered 2020-12-04: 3 [IU] via SUBCUTANEOUS
  Administered 2020-12-04 – 2020-12-05 (×3): 2 [IU] via SUBCUTANEOUS
  Administered 2020-12-05: 3 [IU] via SUBCUTANEOUS
  Administered 2020-12-05: 2 [IU] via SUBCUTANEOUS
  Administered 2020-12-05 – 2020-12-06 (×2): 1 [IU] via SUBCUTANEOUS
  Administered 2020-12-06: 2 [IU] via SUBCUTANEOUS
  Administered 2020-12-06: 1 [IU] via SUBCUTANEOUS
  Administered 2020-12-06: 3 [IU] via SUBCUTANEOUS
  Administered 2020-12-06 – 2020-12-07 (×4): 2 [IU] via SUBCUTANEOUS
  Administered 2020-12-08 (×4): 1 [IU] via SUBCUTANEOUS
  Administered 2020-12-09: 2 [IU] via SUBCUTANEOUS
  Administered 2020-12-09: 3 [IU] via SUBCUTANEOUS
  Administered 2020-12-09 (×3): 2 [IU] via SUBCUTANEOUS
  Administered 2020-12-10: 1 [IU] via SUBCUTANEOUS
  Administered 2020-12-10 (×2): 2 [IU] via SUBCUTANEOUS

## 2020-11-30 MED ORDER — FOLIC ACID 1 MG PO TABS
1.0000 mg | ORAL_TABLET | Freq: Every day | ORAL | Status: DC
  Administered 2020-11-30 – 2020-12-15 (×16): 1 mg
  Filled 2020-11-30 (×16): qty 1

## 2020-11-30 MED ORDER — CHLORDIAZEPOXIDE HCL 5 MG PO CAPS
5.0000 mg | ORAL_CAPSULE | Freq: Three times a day (TID) | ORAL | Status: DC
  Administered 2020-11-30 – 2020-12-09 (×26): 5 mg
  Filled 2020-11-30 (×26): qty 1
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
## 2020-12-01 LAB — GLUCOSE, CAPILLARY
Glucose-Capillary: 195 mg/dL — ABNORMAL HIGH (ref 70–99)
Glucose-Capillary: 229 mg/dL — ABNORMAL HIGH (ref 70–99)
Glucose-Capillary: 243 mg/dL — ABNORMAL HIGH (ref 70–99)
Glucose-Capillary: 257 mg/dL — ABNORMAL HIGH (ref 70–99)
Glucose-Capillary: 258 mg/dL — ABNORMAL HIGH (ref 70–99)
Glucose-Capillary: 280 mg/dL — ABNORMAL HIGH (ref 70–99)

## 2020-12-01 LAB — BASIC METABOLIC PANEL
Anion gap: 5 (ref 5–15)
BUN: 23 mg/dL — ABNORMAL HIGH (ref 6–20)
CO2: 25 mmol/L (ref 22–32)
Calcium: 8.3 mg/dL — ABNORMAL LOW (ref 8.9–10.3)
Chloride: 104 mmol/L (ref 98–111)
Creatinine, Ser: 0.91 mg/dL (ref 0.61–1.24)
GFR, Estimated: >60 mL/min (ref >60)
Glucose, Bld: 220 mg/dL — ABNORMAL HIGH (ref 70–99)
Potassium: 3.4 mmol/L — ABNORMAL LOW (ref 3.5–5.1)
Sodium: 134 mmol/L — ABNORMAL LOW (ref 135–145)

## 2020-12-01 LAB — CBC
HCT: 28.9 % — ABNORMAL LOW (ref 39.0–52.0)
Hemoglobin: 9.4 g/dL — ABNORMAL LOW (ref 13.0–17.0)
MCH: 31.1 pg (ref 26.0–34.0)
MCHC: 32.5 g/dL (ref 30.0–36.0)
MCV: 95.7 fL (ref 80.0–100.0)
Platelets: 376 10*3/uL (ref 150–400)
RBC: 3.02 MIL/uL — ABNORMAL LOW (ref 4.22–5.81)
RDW: 15.8 % — ABNORMAL HIGH (ref 11.5–15.5)
WBC: 13.8 10*3/uL — ABNORMAL HIGH (ref 4.0–10.5)
nRBC: 0 % (ref 0.0–0.2)

## 2020-12-01 LAB — TRIGLYCERIDES: Triglycerides: 143 mg/dL (ref <150)

## 2020-12-01 MED ORDER — METOPROLOL TARTRATE 25 MG/10 ML ORAL SUSPENSION
25.0000 mg | Freq: Two times a day (BID) | ORAL | Status: DC
  Administered 2020-12-01 – 2020-12-15 (×28): 25 mg
  Filled 2020-12-01 (×28): qty 10

## 2020-12-01 MED ORDER — POTASSIUM CHLORIDE 20 MEQ PO PACK
40.0000 meq | PACK | Freq: Two times a day (BID) | ORAL | Status: AC
  Administered 2020-12-01 (×2): 40 meq
  Filled 2020-12-01 (×2): qty 2

## 2020-12-01 MED ORDER — FUROSEMIDE 10 MG/ML IJ SOLN
40.0000 mg | Freq: Once | INTRAMUSCULAR | Status: AC
  Administered 2020-12-01: 40 mg via INTRAVENOUS
  Filled 2020-12-01: qty 4
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
## 2020-12-02 ENCOUNTER — Inpatient Hospital Stay: Payer: Self-pay

## 2020-12-02 LAB — CBC
HCT: 30.3 % — ABNORMAL LOW (ref 39.0–52.0)
Hemoglobin: 10.1 g/dL — ABNORMAL LOW (ref 13.0–17.0)
MCH: 30.9 pg (ref 26.0–34.0)
MCHC: 33.3 g/dL (ref 30.0–36.0)
MCV: 92.7 fL (ref 80.0–100.0)
Platelets: 377 10*3/uL (ref 150–400)
RBC: 3.27 MIL/uL — ABNORMAL LOW (ref 4.22–5.81)
RDW: 15.7 % — ABNORMAL HIGH (ref 11.5–15.5)
WBC: 16.4 10*3/uL — ABNORMAL HIGH (ref 4.0–10.5)
nRBC: 0 % (ref 0.0–0.2)

## 2020-12-02 LAB — BASIC METABOLIC PANEL
Anion gap: 7 (ref 5–15)
BUN: 29 mg/dL — ABNORMAL HIGH (ref 6–20)
CO2: 24 mmol/L (ref 22–32)
Calcium: 8.9 mg/dL (ref 8.9–10.3)
Chloride: 106 mmol/L (ref 98–111)
Creatinine, Ser: 1.01 mg/dL (ref 0.61–1.24)
GFR, Estimated: >60 mL/min (ref >60)
Glucose, Bld: 304 mg/dL — ABNORMAL HIGH (ref 70–99)
Potassium: 5.1 mmol/L (ref 3.5–5.1)
Sodium: 137 mmol/L (ref 135–145)

## 2020-12-02 LAB — GLUCOSE, CAPILLARY
Glucose-Capillary: 258 mg/dL — ABNORMAL HIGH (ref 70–99)
Glucose-Capillary: 256 mg/dL — ABNORMAL HIGH (ref 70–99)
Glucose-Capillary: 341 mg/dL — ABNORMAL HIGH (ref 70–99)
Glucose-Capillary: 280 mg/dL — ABNORMAL HIGH (ref 70–99)
Glucose-Capillary: 277 mg/dL — ABNORMAL HIGH (ref 70–99)
Glucose-Capillary: 274 mg/dL — ABNORMAL HIGH (ref 70–99)

## 2020-12-02 MED ORDER — SODIUM CHLORIDE 0.9% FLUSH: 10.0000 mL | INTRAVENOUS | Status: DC | PRN

## 2020-12-02 MED ORDER — SODIUM CHLORIDE 0.9% FLUSH
10.0000 mL | Freq: Two times a day (BID) | INTRAVENOUS | Status: DC
  Administered 2020-12-03: 10 mL
  Administered 2020-12-03: 20 mL
  Administered 2020-12-03 – 2020-12-06 (×6): 10 mL
  Administered 2020-12-06 – 2020-12-07 (×2): 20 mL
  Administered 2020-12-07: 10 mL
  Administered 2020-12-08: 30 mL
  Administered 2020-12-08 – 2020-12-10 (×4): 10 mL
  Administered 2020-12-10: 20 mL
  Administered 2020-12-11 (×3): 10 mL
  Administered 2020-12-12: 20 mL
  Administered 2020-12-12: 10 mL
  Administered 2020-12-13: 20 mL
  Administered 2020-12-13 – 2020-12-19 (×8): 10 mL

## 2020-12-02 MED ORDER — FUROSEMIDE 10 MG/ML IJ SOLN
40.0000 mg | Freq: Once | INTRAMUSCULAR | Status: AC
  Administered 2020-12-02: 40 mg via INTRAVENOUS
  Filled 2020-12-02: qty 4

## 2020-12-02 MED ORDER — INSULIN GLARGINE-YFGN 100 UNIT/ML ~~LOC~~ SOLN
10.0000 [IU] | Freq: Two times a day (BID) | SUBCUTANEOUS | Status: DC
  Administered 2020-12-02 (×2): 10 [IU] via SUBCUTANEOUS
  Filled 2020-12-02 (×4): qty 0.1

## 2020-12-02 NOTE — Progress Notes (Signed)
Attempt to call [name] (son) no answer mailbox is full, no message left.  Attempt to call Vaughan Basta (son) [phone], phone is listed as not in service.  Bedside RN Victorino Dike notified.  Until able to obtain consent PICC is not able to be placed unless MD wishes to give medical necessity consent.

## 2020-12-02 NOTE — Progress Notes (Signed)
Peripherally Inserted Central Catheter Placement  The IV Nurse has discussed with the patient and/or persons authorized to consent for the patient, the purpose of this procedure and the potential benefits and risks involved with this procedure.  The benefits include less needle sticks, lab draws from the catheter, and the patient may be discharged home with the catheter. Risks include, but not limited to, infection, bleeding, blood clot (thrombus formation), and puncture of an artery; nerve damage and irregular heartbeat and possibility to perform a PICC exchange if needed/ordered by physician.  Alternatives to this procedure were also discussed.  Bard Power PICC patient education guide, fact sheet on infection prevention and patient information card has been provided to patient /or left at bedside.    PICC Placement Documentation  PICC Double Lumen 12/02/20 PICC Right Brachial 42 cm 0 cm (Active)       Nickola Lenig, Lajean Manes 12/02/2020, 3:37 PM
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
## 2020-12-02 NOTE — Progress Notes (Signed)
Patient ID: Benjamin Clements, male   DOB: July 12, 1960, 60 y.o.   MRN: [id number] I met with his sister at the bedside for a clinical update.  Benjamin Skeans, MD, MPH, FACS Please use [email or web address] to contact on call provider

## 2020-12-03 LAB — GLUCOSE, CAPILLARY
Glucose-Capillary: 159 mg/dL — ABNORMAL HIGH (ref 70–99)
Glucose-Capillary: 181 mg/dL — ABNORMAL HIGH (ref 70–99)
Glucose-Capillary: 193 mg/dL — ABNORMAL HIGH (ref 70–99)
Glucose-Capillary: 200 mg/dL — ABNORMAL HIGH (ref 70–99)
Glucose-Capillary: 216 mg/dL — ABNORMAL HIGH (ref 70–99)
Glucose-Capillary: 240 mg/dL — ABNORMAL HIGH (ref 70–99)

## 2020-12-03 MED ORDER — GLYCOPYRROLATE 0.2 MG/ML IJ SOLN
0.2000 mg | Freq: Three times a day (TID) | INTRAMUSCULAR | Status: DC
  Administered 2020-12-03 – 2020-12-05 (×9): 0.2 mg via INTRAVENOUS
  Filled 2020-12-03 (×9): qty 1

## 2020-12-03 MED ORDER — HYDRALAZINE HCL 50 MG PO TABS
50.0000 mg | ORAL_TABLET | Freq: Three times a day (TID) | ORAL | Status: DC
  Administered 2020-12-03 – 2020-12-15 (×33): 50 mg
  Filled 2020-12-03 (×35): qty 1

## 2020-12-03 MED ORDER — AMLODIPINE BESYLATE 10 MG PO TABS
10.0000 mg | ORAL_TABLET | Freq: Every day | ORAL | Status: DC
  Administered 2020-12-03 – 2020-12-15 (×13): 10 mg
  Filled 2020-12-03 (×13): qty 1

## 2020-12-03 MED ORDER — INSULIN GLARGINE-YFGN 100 UNIT/ML ~~LOC~~ SOLN
20.0000 [IU] | Freq: Two times a day (BID) | SUBCUTANEOUS | Status: DC
  Administered 2020-12-03 – 2020-12-14 (×22): 20 [IU] via SUBCUTANEOUS
  Filled 2020-12-03 (×27): qty 0.2
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
## 2020-12-04 LAB — CBC
HCT: 28.8 % — ABNORMAL LOW (ref 39.0–52.0)
Hemoglobin: 9 g/dL — ABNORMAL LOW (ref 13.0–17.0)
MCH: 30.5 pg (ref 26.0–34.0)
MCHC: 31.3 g/dL (ref 30.0–36.0)
MCV: 97.6 fL (ref 80.0–100.0)
Platelets: 502 10*3/uL — ABNORMAL HIGH (ref 150–400)
RBC: 2.95 MIL/uL — ABNORMAL LOW (ref 4.22–5.81)
RDW: 16.3 % — ABNORMAL HIGH (ref 11.5–15.5)
WBC: 16.5 10*3/uL — ABNORMAL HIGH (ref 4.0–10.5)
nRBC: 0 % (ref 0.0–0.2)

## 2020-12-04 LAB — BASIC METABOLIC PANEL
Anion gap: 6 (ref 5–15)
BUN: 33 mg/dL — ABNORMAL HIGH (ref 6–20)
CO2: 28 mmol/L (ref 22–32)
Calcium: 8.8 mg/dL — ABNORMAL LOW (ref 8.9–10.3)
Chloride: 105 mmol/L (ref 98–111)
Creatinine, Ser: 0.89 mg/dL (ref 0.61–1.24)
GFR, Estimated: >60 mL/min (ref >60)
Glucose, Bld: 188 mg/dL — ABNORMAL HIGH (ref 70–99)
Potassium: 3.5 mmol/L (ref 3.5–5.1)
Sodium: 139 mmol/L (ref 135–145)

## 2020-12-04 LAB — GLUCOSE, CAPILLARY
Glucose-Capillary: 185 mg/dL — ABNORMAL HIGH (ref 70–99)
Glucose-Capillary: 192 mg/dL — ABNORMAL HIGH (ref 70–99)
Glucose-Capillary: 244 mg/dL — ABNORMAL HIGH (ref 70–99)
Glucose-Capillary: 179 mg/dL — ABNORMAL HIGH (ref 70–99)
Glucose-Capillary: 205 mg/dL — ABNORMAL HIGH (ref 70–99)
Glucose-Capillary: 216 mg/dL — ABNORMAL HIGH (ref 70–99)

## 2020-12-04 LAB — TRIGLYCERIDES: Triglycerides: 110 mg/dL (ref <150)

## 2020-12-04 MED ORDER — FENTANYL CITRATE (PF) 100 MCG/2ML IJ SOLN
50.0000 ug | INTRAMUSCULAR | Status: DC | PRN
Start: 2020-12-04 — End: 2020-12-07
  Administered 2020-12-04: 50 ug via INTRAVENOUS
  Administered 2020-12-04: 100 ug via INTRAVENOUS
  Administered 2020-12-07: 50 ug via INTRAVENOUS
  Filled 2020-12-04 (×4): qty 2

## 2020-12-04 MED ORDER — CHLORHEXIDINE GLUCONATE CLOTH 2 % EX PADS
6.0000 | MEDICATED_PAD | Freq: Every day | CUTANEOUS | Status: DC
  Administered 2020-12-05 – 2020-12-17 (×13): 6 via TOPICAL
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
## 2020-12-05 LAB — BASIC METABOLIC PANEL
Anion gap: 8 (ref 5–15)
BUN: 30 mg/dL — ABNORMAL HIGH (ref 6–20)
CO2: 28 mmol/L (ref 22–32)
Calcium: 8.9 mg/dL (ref 8.9–10.3)
Chloride: 101 mmol/L (ref 98–111)
Creatinine, Ser: 0.86 mg/dL (ref 0.61–1.24)
GFR, Estimated: >60 mL/min (ref >60)
Glucose, Bld: 139 mg/dL — ABNORMAL HIGH (ref 70–99)
Potassium: 3.6 mmol/L (ref 3.5–5.1)
Sodium: 137 mmol/L (ref 135–145)

## 2020-12-05 LAB — GLUCOSE, CAPILLARY
Glucose-Capillary: 115 mg/dL — ABNORMAL HIGH (ref 70–99)
Glucose-Capillary: 134 mg/dL — ABNORMAL HIGH (ref 70–99)
Glucose-Capillary: 152 mg/dL — ABNORMAL HIGH (ref 70–99)
Glucose-Capillary: 168 mg/dL — ABNORMAL HIGH (ref 70–99)
Glucose-Capillary: 190 mg/dL — ABNORMAL HIGH (ref 70–99)
Glucose-Capillary: 222 mg/dL — ABNORMAL HIGH (ref 70–99)

## 2020-12-05 LAB — CBC
HCT: 28.9 % — ABNORMAL LOW (ref 39.0–52.0)
Hemoglobin: 9.1 g/dL — ABNORMAL LOW (ref 13.0–17.0)
MCH: 30.6 pg (ref 26.0–34.0)
MCHC: 31.5 g/dL (ref 30.0–36.0)
MCV: 97.3 fL (ref 80.0–100.0)
Platelets: 553 10*3/uL — ABNORMAL HIGH (ref 150–400)
RBC: 2.97 MIL/uL — ABNORMAL LOW (ref 4.22–5.81)
RDW: 16.4 % — ABNORMAL HIGH (ref 11.5–15.5)
WBC: 17.7 10*3/uL — ABNORMAL HIGH (ref 4.0–10.5)
nRBC: 0 % (ref 0.0–0.2)
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
## 2020-12-06 LAB — GLUCOSE, CAPILLARY
Glucose-Capillary: 134 mg/dL — ABNORMAL HIGH (ref 70–99)
Glucose-Capillary: 135 mg/dL — ABNORMAL HIGH (ref 70–99)
Glucose-Capillary: 153 mg/dL — ABNORMAL HIGH (ref 70–99)
Glucose-Capillary: 162 mg/dL — ABNORMAL HIGH (ref 70–99)
Glucose-Capillary: 178 mg/dL — ABNORMAL HIGH (ref 70–99)
Glucose-Capillary: 217 mg/dL — ABNORMAL HIGH (ref 70–99)

## 2020-12-06 LAB — BASIC METABOLIC PANEL
Anion gap: 6 (ref 5–15)
BUN: 32 mg/dL — ABNORMAL HIGH (ref 6–20)
CO2: 26 mmol/L (ref 22–32)
Calcium: 8.4 mg/dL — ABNORMAL LOW (ref 8.9–10.3)
Chloride: 102 mmol/L (ref 98–111)
Creatinine, Ser: 0.79 mg/dL (ref 0.61–1.24)
GFR, Estimated: >60 mL/min (ref >60)
Glucose, Bld: 187 mg/dL — ABNORMAL HIGH (ref 70–99)
Potassium: 3.9 mmol/L (ref 3.5–5.1)
Sodium: 134 mmol/L — ABNORMAL LOW (ref 135–145)

## 2020-12-06 LAB — CBC
HCT: 29.1 % — ABNORMAL LOW (ref 39.0–52.0)
Hemoglobin: 9.3 g/dL — ABNORMAL LOW (ref 13.0–17.0)
MCH: 31.2 pg (ref 26.0–34.0)
MCHC: 32 g/dL (ref 30.0–36.0)
MCV: 97.7 fL (ref 80.0–100.0)
Platelets: 612 10*3/uL — ABNORMAL HIGH (ref 150–400)
RBC: 2.98 MIL/uL — ABNORMAL LOW (ref 4.22–5.81)
RDW: 16.4 % — ABNORMAL HIGH (ref 11.5–15.5)
WBC: 16.4 10*3/uL — ABNORMAL HIGH (ref 4.0–10.5)
nRBC: 0 % (ref 0.0–0.2)

## 2020-12-06 MED ORDER — QUETIAPINE FUMARATE 50 MG PO TABS
50.0000 mg | ORAL_TABLET | Freq: Two times a day (BID) | ORAL | Status: DC
  Administered 2020-12-06 – 2020-12-09 (×7): 50 mg
  Filled 2020-12-06: qty 1
  Filled 2020-12-06 (×6): qty 2

## 2020-12-06 MED ORDER — GLYCOPYRROLATE 0.2 MG/ML IJ SOLN
0.4000 mg | Freq: Three times a day (TID) | INTRAMUSCULAR | Status: DC
  Administered 2020-12-06 – 2020-12-15 (×28): 0.4 mg via INTRAVENOUS
  Filled 2020-12-06 (×28): qty 2
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
## 2020-12-07 LAB — CBC
HCT: 30.4 % — ABNORMAL LOW (ref 39.0–52.0)
Hemoglobin: 9.7 g/dL — ABNORMAL LOW (ref 13.0–17.0)
MCH: 31.2 pg (ref 26.0–34.0)
MCHC: 31.9 g/dL (ref 30.0–36.0)
MCV: 97.7 fL (ref 80.0–100.0)
Platelets: 555 10*3/uL — ABNORMAL HIGH (ref 150–400)
RBC: 3.11 MIL/uL — ABNORMAL LOW (ref 4.22–5.81)
RDW: 16.3 % — ABNORMAL HIGH (ref 11.5–15.5)
WBC: 13.7 10*3/uL — ABNORMAL HIGH (ref 4.0–10.5)
nRBC: 0 % (ref 0.0–0.2)

## 2020-12-07 LAB — GLUCOSE, CAPILLARY
Glucose-Capillary: 110 mg/dL — ABNORMAL HIGH (ref 70–99)
Glucose-Capillary: 144 mg/dL — ABNORMAL HIGH (ref 70–99)
Glucose-Capillary: 180 mg/dL — ABNORMAL HIGH (ref 70–99)
Glucose-Capillary: 186 mg/dL — ABNORMAL HIGH (ref 70–99)
Glucose-Capillary: 69 mg/dL — ABNORMAL LOW (ref 70–99)
Glucose-Capillary: 71 mg/dL (ref 70–99)
Glucose-Capillary: 77 mg/dL (ref 70–99)
Glucose-Capillary: 96 mg/dL (ref 70–99)

## 2020-12-07 LAB — TRIGLYCERIDES: Triglycerides: 75 mg/dL (ref <150)

## 2020-12-07 MED ORDER — HALOPERIDOL 1 MG PO TABS
2.0000 mg | ORAL_TABLET | Freq: Three times a day (TID) | ORAL | Status: DC
  Administered 2020-12-07 – 2020-12-09 (×4): 2 mg
  Filled 2020-12-07 (×7): qty 2

## 2020-12-07 MED ORDER — DEXTROSE 50 % IV SOLN
12.5000 g | Freq: Once | INTRAVENOUS | Status: AC
  Administered 2020-12-07: 12.5 g via INTRAVENOUS
  Filled 2020-12-07: qty 50

## 2020-12-07 MED ORDER — HALOPERIDOL LACTATE 5 MG/ML IJ SOLN
2.0000 mg | Freq: Three times a day (TID) | INTRAMUSCULAR | Status: DC
Start: 2020-12-07 — End: 2020-12-09
  Administered 2020-12-07 – 2020-12-09 (×2): 2 mg via INTRAVENOUS
  Filled 2020-12-07 (×3): qty 1

## 2020-12-07 MED ORDER — CHLORHEXIDINE GLUCONATE 0.12 % MT SOLN
15.0000 mL | Freq: Two times a day (BID) | OROMUCOSAL | Status: DC
  Administered 2020-12-07 – 2020-12-19 (×23): 15 mL via OROMUCOSAL
  Filled 2020-12-07 (×21): qty 15

## 2020-12-07 MED ORDER — MORPHINE SULFATE (PF) 2 MG/ML IV SOLN
2.0000 mg | INTRAVENOUS | Status: DC | PRN
  Administered 2020-12-08 (×2): 4 mg via INTRAVENOUS
  Administered 2020-12-09: 2 mg via INTRAVENOUS
  Administered 2020-12-09: 4 mg via INTRAVENOUS
  Administered 2020-12-10: 2 mg via INTRAVENOUS
  Filled 2020-12-07: qty 1
  Filled 2020-12-07: qty 2
  Filled 2020-12-07: qty 1
  Filled 2020-12-07 (×2): qty 2

## 2020-12-07 MED ORDER — ORAL CARE MOUTH RINSE
15.0000 mL | Freq: Two times a day (BID) | OROMUCOSAL | Status: DC
  Administered 2020-12-08 – 2020-12-19 (×17): 15 mL via OROMUCOSAL
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
## 2020-12-07 NOTE — Progress Notes (Signed)
Blood glucose rechecked and is 96.

## 2020-12-07 NOTE — Progress Notes (Signed)
Foley inserted for urinary retention and patient has been in and out cathed several times within the last 24 hours.

## 2020-12-07 NOTE — Progress Notes (Signed)
Tube feeds restarted per Dr. Bedelia Person. Glucose was 69 therefore 12.5 g D50 given per protocol. Will recheck blood glucose. No s/s of hypoglycemia.
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
## 2020-12-07 NOTE — Procedures (Signed)
Extubation Procedure Note  Patient Details:   Name: Benjamin Clements DOB: [birth year] MRN: [id number]   Airway Documentation:    Vent end date: 12/07/20 Vent end time: 1240   Evaluation  O2 sats: stable throughout Complications: No apparent complications Patient did tolerate procedure well. Bilateral Breath Sounds: Clear, Diminished   Yes  Pt was extubated per MD order and placed on 3 L [place]. Pt is stable at this time. RT will monitor.   Merlene Laughter 12/07/2020, 12:45 PM

## 2020-12-08 ENCOUNTER — Encounter (HOSPITAL_COMMUNITY): Payer: Self-pay

## 2020-12-08 ENCOUNTER — Other Ambulatory Visit: Payer: Self-pay

## 2020-12-08 LAB — GLUCOSE, CAPILLARY
Glucose-Capillary: 127 mg/dL — ABNORMAL HIGH (ref 70–99)
Glucose-Capillary: 124 mg/dL — ABNORMAL HIGH (ref 70–99)
Glucose-Capillary: 115 mg/dL — ABNORMAL HIGH (ref 70–99)
Glucose-Capillary: 127 mg/dL — ABNORMAL HIGH (ref 70–99)
Glucose-Capillary: 72 mg/dL (ref 70–99)
Glucose-Capillary: 96 mg/dL (ref 70–99)

## 2020-12-08 NOTE — Progress Notes (Addendum)
Dr. Janee Morn present and discussed haldol and qtc with RN. MD gave order to hold haldol for QTC > 500

## 2020-12-08 NOTE — Progress Notes (Signed)
OT Cancellation Note  Patient Details Name: Benjamin Clements MRN: [id number] DOB: 1961/04/04   Cancelled Treatment:    Reason Eval/Treat Not Completed: Other (comment) (Received Haldol. Lethargic. Will return as schedule allows.)  Alyah Boehning M Shawanna Zanders Makaylynn Bonillas MSOT, OTR/L Acute Rehab Pager: [phone] Office: [phone] 12/08/2020, 2:56 PM
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
## 2020-12-08 NOTE — Progress Notes (Signed)
SLP Cancellation Note  Patient Details Name: Benjamin Clements MRN: [id number] DOB: [birth year]   Cancelled treatment:       Reason Eval/Treat Not Completed: Fatigue/lethargy limiting ability to participate. Pt currently will arouse briefly, then return to sleep quickly. He is currently inappropriate for swallow evaluation. Cortrak in place.   RN reports potential behavioral issues. Will continue efforts to complete BSE/SLE.  Benjamin Clements B. Murvin Natal, [facility], CCC-SLP Speech Language Pathologist Office: [phone]  Leigh Aurora 12/08/2020, 10:46 AM

## 2020-12-08 NOTE — Progress Notes (Signed)
PT Cancellation Note  Patient Details Name: Benjamin Clements MRN: [id number] DOB: 1961/02/26   Cancelled Treatment:    Reason Eval/Treat Not Completed: Other (comment).  Pt has just had his dose of haldol and is sleeping hard.  RN states he was getting agitated and would prefer I hold my attempts to arouse/mobilize/stimulate pt until tomorrow.  She reports his best time today was between 9am and 10am, so we may attempt back during those hours.   Thanks,  Benjamin Clements, PT, DPT  Acute Rehabilitation Ortho Tech Supervisor [phone] pager #[phone] office      Benjamin Clements 12/08/2020, 2:13 PM
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
## 2020-12-09 LAB — GLUCOSE, CAPILLARY
Glucose-Capillary: 149 mg/dL — ABNORMAL HIGH (ref 70–99)
Glucose-Capillary: 154 mg/dL — ABNORMAL HIGH (ref 70–99)
Glucose-Capillary: 155 mg/dL — ABNORMAL HIGH (ref 70–99)
Glucose-Capillary: 176 mg/dL — ABNORMAL HIGH (ref 70–99)
Glucose-Capillary: 199 mg/dL — ABNORMAL HIGH (ref 70–99)
Glucose-Capillary: 203 mg/dL — ABNORMAL HIGH (ref 70–99)

## 2020-12-09 MED ORDER — HALOPERIDOL 1 MG PO TABS
5.0000 mg | ORAL_TABLET | Freq: Three times a day (TID) | ORAL | Status: DC
  Administered 2020-12-09 – 2020-12-15 (×12): 5 mg
  Filled 2020-12-09 (×16): qty 5

## 2020-12-09 MED ORDER — HALOPERIDOL LACTATE 5 MG/ML IJ SOLN
2.0000 mg | Freq: Three times a day (TID) | INTRAMUSCULAR | Status: DC
  Administered 2020-12-09 – 2020-12-14 (×5): 2 mg via INTRAVENOUS
  Filled 2020-12-09 (×7): qty 1

## 2020-12-09 MED ORDER — QUETIAPINE FUMARATE 50 MG PO TABS
25.0000 mg | ORAL_TABLET | Freq: Two times a day (BID) | ORAL | Status: AC
  Administered 2020-12-09 – 2020-12-10 (×2): 25 mg
  Filled 2020-12-09 (×2): qty 1

## 2020-12-09 MED ORDER — LORAZEPAM 2 MG/ML IJ SOLN
0.5000 mg | Freq: Four times a day (QID) | INTRAMUSCULAR | Status: DC | PRN
  Administered 2020-12-10 – 2020-12-11 (×2): 0.5 mg via INTRAVENOUS
  Filled 2020-12-09 (×2): qty 1
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
## 2020-12-09 NOTE — TOC Progression Note (Signed)
Transition of Care [facility]) - Progression Note    Patient Details  Name: Benjamin Clements MRN: [id number] Date of Birth: [birth year]  [facility] CM/SW Contact  Carley Hammed, [place] Phone Number: 12/09/2020, 12:39 PM  Clinical Narrative:    CSW was notified that pt's sister, Silvio Pate, was requesting a call back. CSW spoke with [facility] who had general questions about next steps. CSW noted pt still has Cortrak and has not had a swallow eval. CSW spoke with sister about Medicaid, HCPOA, and medicare. Gov long term bed offers. Sister understood the current barriers and CSW advised we would know more about placement after it was decided how pt would get his nutrition. Sister noted understanding. TOC will continue to follow for disposition.     Barriers to Discharge: Continued Medical Work up  Expected Discharge Plan and Services     Discharge Planning Services: CM Consult   Living arrangements for the past 2 months: Boarding House                                       Social Determinants of Health (SDOH) Interventions    Readmission Risk Interventions No flowsheet data found.

## 2020-12-10 ENCOUNTER — Inpatient Hospital Stay (HOSPITAL_COMMUNITY): Payer: Medicaid Other

## 2020-12-10 ENCOUNTER — Other Ambulatory Visit: Payer: Self-pay

## 2020-12-10 LAB — BASIC METABOLIC PANEL
Anion gap: 7 (ref 5–15)
BUN: 28 mg/dL — ABNORMAL HIGH (ref 6–20)
CO2: 26 mmol/L (ref 22–32)
Calcium: 9.2 mg/dL (ref 8.9–10.3)
Chloride: 101 mmol/L (ref 98–111)
Creatinine, Ser: 0.86 mg/dL (ref 0.61–1.24)
GFR, Estimated: >60 mL/min (ref >60)
Glucose, Bld: 178 mg/dL — ABNORMAL HIGH (ref 70–99)
Potassium: 4.3 mmol/L (ref 3.5–5.1)
Sodium: 134 mmol/L — ABNORMAL LOW (ref 135–145)

## 2020-12-10 LAB — GLUCOSE, CAPILLARY
Glucose-Capillary: 160 mg/dL — ABNORMAL HIGH (ref 70–99)
Glucose-Capillary: 194 mg/dL — ABNORMAL HIGH (ref 70–99)
Glucose-Capillary: 101 mg/dL — ABNORMAL HIGH (ref 70–99)
Glucose-Capillary: 187 mg/dL — ABNORMAL HIGH (ref 70–99)
Glucose-Capillary: 156 mg/dL — ABNORMAL HIGH (ref 70–99)
Glucose-Capillary: 203 mg/dL — ABNORMAL HIGH (ref 70–99)

## 2020-12-10 LAB — CBC
HCT: 32.4 % — ABNORMAL LOW (ref 39.0–52.0)
Hemoglobin: 10.7 g/dL — ABNORMAL LOW (ref 13.0–17.0)
MCH: 31.2 pg (ref 26.0–34.0)
MCHC: 33 g/dL (ref 30.0–36.0)
MCV: 94.5 fL (ref 80.0–100.0)
Platelets: 518 10*3/uL — ABNORMAL HIGH (ref 150–400)
RBC: 3.43 MIL/uL — ABNORMAL LOW (ref 4.22–5.81)
RDW: 15.4 % (ref 11.5–15.5)
WBC: 15.8 10*3/uL — ABNORMAL HIGH (ref 4.0–10.5)
nRBC: 0 % (ref 0.0–0.2)

## 2020-12-10 LAB — TRIGLYCERIDES: Triglycerides: 59 mg/dL (ref <150)

## 2020-12-10 MED ORDER — MORPHINE SULFATE (PF) 2 MG/ML IV SOLN
2.0000 mg | INTRAVENOUS | Status: DC | PRN
  Administered 2020-12-11 – 2020-12-16 (×5): 2 mg via INTRAVENOUS
  Filled 2020-12-10 (×5): qty 1

## 2020-12-10 MED ORDER — INSULIN ASPART 100 UNIT/ML IJ SOLN
0.0000 [IU] | Freq: Three times a day (TID) | INTRAMUSCULAR | Status: DC
  Administered 2020-12-10 – 2020-12-12 (×4): 3 [IU] via SUBCUTANEOUS
  Administered 2020-12-12: 2 [IU] via SUBCUTANEOUS
  Administered 2020-12-12 – 2020-12-14 (×4): 3 [IU] via SUBCUTANEOUS
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
## 2020-12-10 NOTE — Progress Notes (Signed)
RT called to bedside by RN to assess pt. Pt resting on 2l [place] tolerating well, Vitals stable, Spo2 100%. RT will continue to monitor.   12/10/20 1055  Therapy Vitals  Pulse Rate 90  Resp 19  MEWS Score/Color  MEWS Score 0  MEWS Score Color Green  Respiratory Assessment  Assessment Type Assess only  Respiratory Pattern Regular;Unlabored  Chest Assessment Chest expansion symmetrical  Cough None  Bilateral Breath Sounds Rhonchi;Diminished  Oxygen Therapy/Pulse Ox  O2 Device Nasal Cannula  O2 Therapy Oxygen  O2 Flow Rate (L/min) 2 L/min  SpO2 100 %

## 2020-12-11 ENCOUNTER — Inpatient Hospital Stay (HOSPITAL_COMMUNITY): Payer: Medicaid Other

## 2020-12-11 LAB — CBC
HCT: 34 % — ABNORMAL LOW (ref 39.0–52.0)
Hemoglobin: 10.7 g/dL — ABNORMAL LOW (ref 13.0–17.0)
MCH: 30.2 pg (ref 26.0–34.0)
MCHC: 31.5 g/dL (ref 30.0–36.0)
MCV: 96 fL (ref 80.0–100.0)
Platelets: 541 10*3/uL — ABNORMAL HIGH (ref 150–400)
RBC: 3.54 MIL/uL — ABNORMAL LOW (ref 4.22–5.81)
RDW: 15.4 % (ref 11.5–15.5)
WBC: 14.5 10*3/uL — ABNORMAL HIGH (ref 4.0–10.5)
nRBC: 0 % (ref 0.0–0.2)

## 2020-12-11 LAB — BASIC METABOLIC PANEL
Anion gap: 8 (ref 5–15)
BUN: 32 mg/dL — ABNORMAL HIGH (ref 6–20)
CO2: 23 mmol/L (ref 22–32)
Calcium: 9.3 mg/dL (ref 8.9–10.3)
Chloride: 103 mmol/L (ref 98–111)
Creatinine, Ser: 0.87 mg/dL (ref 0.61–1.24)
GFR, Estimated: >60 mL/min (ref >60)
Glucose, Bld: 201 mg/dL — ABNORMAL HIGH (ref 70–99)
Potassium: 4.3 mmol/L (ref 3.5–5.1)
Sodium: 134 mmol/L — ABNORMAL LOW (ref 135–145)

## 2020-12-11 LAB — GLUCOSE, CAPILLARY
Glucose-Capillary: 166 mg/dL — ABNORMAL HIGH (ref 70–99)
Glucose-Capillary: 179 mg/dL — ABNORMAL HIGH (ref 70–99)
Glucose-Capillary: 148 mg/dL — ABNORMAL HIGH (ref 70–99)
Glucose-Capillary: 104 mg/dL — ABNORMAL HIGH (ref 70–99)
Glucose-Capillary: 211 mg/dL — ABNORMAL HIGH (ref 70–99)

## 2020-12-11 MED ORDER — LORAZEPAM 2 MG/ML IJ SOLN
0.5000 mg | Freq: Four times a day (QID) | INTRAMUSCULAR | Status: AC | PRN
  Administered 2020-12-11 – 2020-12-13 (×5): 0.5 mg via INTRAVENOUS
  Filled 2020-12-11 (×5): qty 1

## 2020-12-11 NOTE — Plan of Care (Signed)
  Problem: Safety: Goal: Non-violent Restraint(s) Outcome: Progressing   Problem: Activity: Goal: Ability to tolerate increased activity will improve Outcome: Progressing   Problem: Respiratory: Goal: Ability to maintain a clear airway and adequate ventilation will improve Outcome: Progressing   Problem: Role Relationship: Goal: Method of communication will improve Outcome: Progressing   Problem: Education: Goal: Knowledge of General Education information will improve Description: Including pain rating scale, medication(s)/side effects and non-pharmacologic comfort measures Outcome: Progressing   Problem: Health Behavior/Discharge Planning: Goal: Ability to manage health-related needs will improve Outcome: Progressing   Problem: Clinical Measurements: Goal: Ability to maintain clinical measurements within normal limits will improve Outcome: Progressing Goal: Will remain free from infection Outcome: Progressing Goal: Diagnostic test results will improve Outcome: Progressing Goal: Respiratory complications will improve Outcome: Progressing Goal: Cardiovascular complication will be avoided Outcome: Progressing   Problem: Activity: Goal: Risk for activity intolerance will decrease Outcome: Progressing   Problem: Nutrition: Goal: Adequate nutrition will be maintained Outcome: Progressing   Problem: Coping: Goal: Level of anxiety will decrease Outcome: Progressing   Problem: Elimination: Goal: Will not experience complications related to bowel motility Outcome: Progressing Goal: Will not experience complications related to urinary retention Outcome: Progressing   Problem: Pain Managment: Goal: General experience of comfort will improve Outcome: Progressing   Problem: Safety: Goal: Ability to remain free from injury will improve Outcome: Progressing   Problem: Skin Integrity: Goal: Risk for impaired skin integrity will decrease Outcome: Progressing   

## 2020-12-11 NOTE — Plan of Care (Signed)
Pt with weak/hoarse voice, able to understand some words pt says but mostly difficult to understand pt. Follows directions and nods/gestures appropriately. On first assessment noted pt not moving left leg but pt becoming agitated when RN trying to complete assessment. Scheduled haldol given and later pt reassessed when more cooperative. Pt able to move RLE and both arms but not able to move left leg. Pt grimaces when RN moves left foot/leg but denies pain at rest. Paged Dr. Freida Busman who came to bedside to assess.  Pt able to move left leg a Hobby to painful stimuli. Ankle xray ordered by MD for morning.    Later in night pt woke up pulling off tele leads and agitated. Denies pain. Uncooperative and took RN over 30 minutes to calm pt down. Pt would not let RN give ativan at first then pt states "I just feel agitated" and then let RN give med. Ativan effective and pt able to sleep and more cooperative the rest of the night.    Problem: Respiratory: Goal: Ability to maintain a clear airway and adequate ventilation will improve Outcome: Progressing   Problem: Role Relationship: Goal: Method of communication will improve Outcome: Progressing   Problem: Clinical Measurements: Goal: Ability to maintain clinical measurements within normal limits will improve Outcome: Progressing   Problem: Nutrition: Goal: Adequate nutrition will be maintained Outcome: Progressing   Problem: Activity: Goal: Risk for activity intolerance will decrease Outcome: Not Progressing
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
## 2020-12-12 LAB — CBC
HCT: 38.9 % — ABNORMAL LOW (ref 39.0–52.0)
Hemoglobin: 12.5 g/dL — ABNORMAL LOW (ref 13.0–17.0)
MCH: 30.2 pg (ref 26.0–34.0)
MCHC: 32.1 g/dL (ref 30.0–36.0)
MCV: 94 fL (ref 80.0–100.0)
Platelets: 573 10*3/uL — ABNORMAL HIGH (ref 150–400)
RBC: 4.14 MIL/uL — ABNORMAL LOW (ref 4.22–5.81)
RDW: 15.1 % (ref 11.5–15.5)
WBC: 14.3 10*3/uL — ABNORMAL HIGH (ref 4.0–10.5)
nRBC: 0 % (ref 0.0–0.2)

## 2020-12-12 LAB — GLUCOSE, CAPILLARY
Glucose-Capillary: 200 mg/dL — ABNORMAL HIGH (ref 70–99)
Glucose-Capillary: 132 mg/dL — ABNORMAL HIGH (ref 70–99)
Glucose-Capillary: 147 mg/dL — ABNORMAL HIGH (ref 70–99)

## 2020-12-12 MED ORDER — QUETIAPINE FUMARATE 50 MG PO TABS
25.0000 mg | ORAL_TABLET | Freq: Two times a day (BID) | ORAL | Status: DC
  Administered 2020-12-12 – 2020-12-15 (×6): 25 mg
  Filled 2020-12-12 (×6): qty 1
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
## 2020-12-13 LAB — GLUCOSE, CAPILLARY
Glucose-Capillary: 183 mg/dL — ABNORMAL HIGH (ref 70–99)
Glucose-Capillary: 100 mg/dL — ABNORMAL HIGH (ref 70–99)
Glucose-Capillary: 177 mg/dL — ABNORMAL HIGH (ref 70–99)
Glucose-Capillary: 143 mg/dL — ABNORMAL HIGH (ref 70–99)
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
## 2020-12-13 NOTE — Plan of Care (Signed)
Pt remains in restraints due to confusion and agitation, concern for pulling out Cortrack.    Problem: Safety: Goal: Non-violent Restraint(s) Outcome: Not Progressing   Problem: Activity: Goal: Ability to tolerate increased activity will improve Outcome: Not Progressing   Problem: Respiratory: Goal: Ability to maintain a clear airway and adequate ventilation will improve Outcome: Progressing   Problem: Role Relationship: Goal: Method of communication will improve Outcome: Progressing   Problem: Education: Goal: Knowledge of General Education information will improve Description: Including pain rating scale, medication(s)/side effects and non-pharmacologic comfort measures Outcome: Not Progressing   Problem: Health Behavior/Discharge Planning: Goal: Ability to manage health-related needs will improve Outcome: Not Progressing   Problem: Clinical Measurements: Goal: Ability to maintain clinical measurements within normal limits will improve Outcome: Not Progressing Goal: Will remain free from infection Outcome: Progressing Goal: Diagnostic test results will improve Outcome: Progressing Goal: Respiratory complications will improve Outcome: Progressing Goal: Cardiovascular complication will be avoided Outcome: Progressing   Problem: Activity: Goal: Risk for activity intolerance will decrease Outcome: Not Progressing   Problem: Nutrition: Goal: Adequate nutrition will be maintained Outcome: Progressing   Problem: Elimination: Goal: Will not experience complications related to bowel motility Outcome: Progressing Goal: Will not experience complications related to urinary retention Outcome: Not Progressing   Problem: Pain Managment: Goal: General experience of comfort will improve Outcome: Progressing   Problem: Coping: Goal: Level of anxiety will decrease Outcome: Not Progressing

## 2020-12-13 NOTE — TOC Progression Note (Addendum)
Transition of Care [facility]) - Progression Note    Patient Details  Name: Milford Cilento MRN: [id number] Date of Birth: Apr 27, 1960  [facility] CM/SW Contact  Glennon Mac, RN Phone Number: 12/13/2020, 3:03 PM  Clinical Narrative:   [facility] Trauma Case Manager continues to follow regarding disposition.  Patient with continued Cortrak feeding tube and n.p.o. status; Speech Therapy continues to follow and monitor swallowing attempts.  Unable to place patient in skilled nursing facility until permanent means of feeding is determined; he will also need to be restraint free for at least 24 hours.  Placement likely to be difficult due to Medicaid as only payor, and current alcohol and substance abuse.  Will continue to follow as patient progresses.    Expected Discharge Plan: Skilled Nursing Facility Barriers to Discharge: Continued Medical Work up  Expected Discharge Plan and Services Expected Discharge Plan: Skilled Nursing Facility   Discharge Planning Services: CM Consult   Living arrangements for the past 2 months: Boarding House                                       Social Determinants of Health (SDOH) Interventions    Readmission Risk Interventions No flowsheet data found.  Quintella Baton, RN, BSN  Trauma/Neuro ICU Case Manager [phone]

## 2020-12-14 LAB — GLUCOSE, CAPILLARY
Glucose-Capillary: 165 mg/dL — ABNORMAL HIGH (ref 70–99)
Glucose-Capillary: 87 mg/dL (ref 70–99)
Glucose-Capillary: 162 mg/dL — ABNORMAL HIGH (ref 70–99)
Glucose-Capillary: 77 mg/dL (ref 70–99)

## 2020-12-14 MED ORDER — FREE WATER
100.0000 mL | Status: DC
  Administered 2020-12-14: 100 mL

## 2020-12-14 MED ORDER — PIVOT 1.5 CAL PO LIQD
1000.0000 mL | ORAL | Status: DC
  Administered 2020-12-14: 1000 mL
  Filled 2020-12-14 (×3): qty 1000
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
## 2020-12-14 NOTE — Progress Notes (Signed)
SLP Cancellation Note  Patient Details Name: Benjamin Clements MRN: [id number] DOB: [birth year]   Cancelled treatment:        Per OT pt had 3 sedating medications with difficulty sustaining arousal. Will continue efforts   Royce Macadamia 12/14/2020, 11:39 AM.lls Breck [place].Ed Nurse, children's [phone] Office [phone]

## 2020-12-15 ENCOUNTER — Inpatient Hospital Stay (HOSPITAL_COMMUNITY): Payer: Medicaid Other

## 2020-12-15 LAB — CBC
HCT: 34.9 % — ABNORMAL LOW (ref 39.0–52.0)
Hemoglobin: 11 g/dL — ABNORMAL LOW (ref 13.0–17.0)
MCH: 30 pg (ref 26.0–34.0)
MCHC: 31.5 g/dL (ref 30.0–36.0)
MCV: 95.1 fL (ref 80.0–100.0)
Platelets: 438 10*3/uL — ABNORMAL HIGH (ref 150–400)
RBC: 3.67 MIL/uL — ABNORMAL LOW (ref 4.22–5.81)
RDW: 15 % (ref 11.5–15.5)
WBC: 13.4 10*3/uL — ABNORMAL HIGH (ref 4.0–10.5)
nRBC: 0 % (ref 0.0–0.2)

## 2020-12-15 LAB — BASIC METABOLIC PANEL
Anion gap: 8 (ref 5–15)
BUN: 38 mg/dL — ABNORMAL HIGH (ref 6–20)
CO2: 23 mmol/L (ref 22–32)
Calcium: 10.3 mg/dL (ref 8.9–10.3)
Chloride: 105 mmol/L (ref 98–111)
Creatinine, Ser: 1.02 mg/dL (ref 0.61–1.24)
GFR, Estimated: >60 mL/min (ref >60)
Glucose, Bld: 88 mg/dL (ref 70–99)
Potassium: 4.2 mmol/L (ref 3.5–5.1)
Sodium: 136 mmol/L (ref 135–145)

## 2020-12-15 LAB — GLUCOSE, CAPILLARY
Glucose-Capillary: 97 mg/dL (ref 70–99)
Glucose-Capillary: 189 mg/dL — ABNORMAL HIGH (ref 70–99)
Glucose-Capillary: 120 mg/dL — ABNORMAL HIGH (ref 70–99)

## 2020-12-15 MED ORDER — FOLIC ACID 1 MG PO TABS
1.0000 mg | ORAL_TABLET | Freq: Every day | ORAL | Status: DC
  Administered 2020-12-16 – 2020-12-19 (×4): 1 mg via ORAL
  Filled 2020-12-15 (×4): qty 1

## 2020-12-15 MED ORDER — OXYCODONE HCL 5 MG PO TABS
5.0000 mg | ORAL_TABLET | ORAL | Status: DC | PRN
  Administered 2020-12-16 – 2020-12-17 (×4): 10 mg via ORAL
  Filled 2020-12-15 (×5): qty 2

## 2020-12-15 MED ORDER — QUETIAPINE FUMARATE 50 MG PO TABS
25.0000 mg | ORAL_TABLET | Freq: Two times a day (BID) | ORAL | Status: DC
  Administered 2020-12-15 – 2020-12-19 (×8): 25 mg via ORAL
  Filled 2020-12-15 (×8): qty 1

## 2020-12-15 MED ORDER — HALOPERIDOL LACTATE 5 MG/ML IJ SOLN
2.0000 mg | Freq: Three times a day (TID) | INTRAMUSCULAR | Status: DC
  Filled 2020-12-15 (×3): qty 1

## 2020-12-15 MED ORDER — SENNA 8.6 MG PO TABS
1.0000 | ORAL_TABLET | Freq: Every day | ORAL | Status: DC
  Administered 2020-12-16 – 2020-12-19 (×3): 8.6 mg via ORAL
  Filled 2020-12-15 (×4): qty 1

## 2020-12-15 MED ORDER — GUAIFENESIN 100 MG/5ML PO SOLN
15.0000 mL | ORAL | Status: DC | PRN
  Filled 2020-12-15: qty 10

## 2020-12-15 MED ORDER — TAMSULOSIN HCL 0.4 MG PO CAPS
0.4000 mg | ORAL_CAPSULE | Freq: Every day | ORAL | Status: DC
  Administered 2020-12-15 – 2020-12-19 (×5): 0.4 mg via ORAL
  Filled 2020-12-15 (×5): qty 1

## 2020-12-15 MED ORDER — AMLODIPINE BESYLATE 10 MG PO TABS
10.0000 mg | ORAL_TABLET | Freq: Every day | ORAL | Status: DC
  Administered 2020-12-16 – 2020-12-19 (×4): 10 mg via ORAL
  Filled 2020-12-15 (×4): qty 1

## 2020-12-15 MED ORDER — METOPROLOL TARTRATE 25 MG PO TABS
25.0000 mg | ORAL_TABLET | Freq: Two times a day (BID) | ORAL | Status: DC
  Administered 2020-12-15 – 2020-12-19 (×8): 25 mg via ORAL
  Filled 2020-12-15 (×8): qty 1

## 2020-12-15 MED ORDER — BETHANECHOL CHLORIDE 25 MG PO TABS
50.0000 mg | ORAL_TABLET | Freq: Three times a day (TID) | ORAL | Status: DC
  Administered 2020-12-15 – 2020-12-19 (×11): 50 mg via ORAL
  Filled 2020-12-15 (×12): qty 2

## 2020-12-15 MED ORDER — HYDRALAZINE HCL 50 MG PO TABS
50.0000 mg | ORAL_TABLET | Freq: Three times a day (TID) | ORAL | Status: DC
  Administered 2020-12-15 – 2020-12-19 (×10): 50 mg via ORAL
  Filled 2020-12-15 (×12): qty 1

## 2020-12-15 MED ORDER — GLYCOPYRROLATE 1 MG PO TABS
1.0000 mg | ORAL_TABLET | Freq: Three times a day (TID) | ORAL | Status: DC
  Administered 2020-12-15 – 2020-12-19 (×11): 1 mg via ORAL
  Filled 2020-12-15 (×12): qty 1

## 2020-12-15 MED ORDER — HALOPERIDOL 1 MG PO TABS
5.0000 mg | ORAL_TABLET | Freq: Three times a day (TID) | ORAL | Status: DC
  Administered 2020-12-15 – 2020-12-19 (×11): 5 mg via ORAL
  Filled 2020-12-15 (×12): qty 5

## 2020-12-15 MED ORDER — THIAMINE HCL 100 MG PO TABS
100.0000 mg | ORAL_TABLET | Freq: Every day | ORAL | Status: DC
  Administered 2020-12-16 – 2020-12-19 (×4): 100 mg via ORAL
  Filled 2020-12-15 (×4): qty 1

## 2020-12-15 NOTE — Progress Notes (Signed)
Modified Barium Swallow Progress Note  Patient Details  Name: Benjamin Clements MRN: [id number] Date of Birth: 03-14-1961  Today's Date: 12/15/2020  Modified Barium Swallow completed.  Full report located under Chart Review in the Imaging Section.  Brief recommendations include the following:  Clinical Impression  Pt presents with pharyngeal dysphagia characterized by a pharyngeal delay and mildly reduced lingual retraction and anterior laryngeal movement. He demonstrated trace-mild vallecular and pyriform sinus residue, and the swallow was often triggered with the head of the bolus at teh valleculae or pyriform sinuses. Penetration (PAS 3) and subsequent trace silent aspiration (PAS 7) were noted with thin liquids. No instances of penetration/aspiration were noted with nectar thick liquids and laryngeal invasion was eliminated with use of a chin tuck posture. Considering pt's cognitive impairments, SLP questions pt's current ability to consistently implement this. A dysphagia 3 diet with nectar thick liquids is recommended at this time. SLP will follow for treatment.   Swallow Evaluation Recommendations       SLP Diet Recommendations: Dysphagia 3 (Mech soft) solids;Nectar thick liquid   Liquid Administration via: Straw;Cup   Medication Administration: Whole meds with liquid   Supervision: Intermittent supervision to cue for compensatory strategies;Patient able to self feed   Compensations: Slow rate;Small sips/bites   Postural Changes: Seated upright at 90 degrees   Oral Care Recommendations: Oral care BID       Marijke Guadiana I. Vear Clock, MS, CCC-SLP Acute Rehabilitation Services Office number [phone] Pager [phone]  Scheryl Marten 12/15/2020,11:24 AM
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
## 2020-12-15 NOTE — [place] FL2 (Addendum)
[place] MEDICAID FL2 LEVEL OF CARE SCREENING TOOL     IDENTIFICATION  Patient Name: Benjamin Clements Birthdate: 02/06/1961 Sex: male Admission Date (Current Location): 11/22/2020  [place] and [place] Number:  Haynes Bast [id number] Facility and Address:  [facility] [place], [address], [place], [place] [address]      Provider Number: [id number]  Attending Physician Name and Address:  Md, Trauma, MD  Relative Name and Phone Number:  Tadan Shill, sister [phone]    Current Level of Care: Hospital Recommended Level of Care: Skilled Nursing Facility Prior Approval Number:    Date Approved/Denied:   PASRR Number: Pending  Discharge Plan: SNF    Current Diagnoses: Patient Active Problem List   Diagnosis Date Noted   Splenic laceration 11/22/2020    Orientation RESPIRATION BLADDER Height & Weight     Self, Place  Normal External catheter Weight: 55.5 kg Height:  5\' 6"  (167.6 cm)  BEHAVIORAL SYMPTOMS/MOOD NEUROLOGICAL BOWEL NUTRITION STATUS      Continent Diet (dysphagia 3, nectar thick liquids)  AMBULATORY STATUS COMMUNICATION OF NEEDS Skin   Extensive Assist Verbally Normal                       Personal Care Assistance Level of Assistance  Bathing, Feeding, Dressing Bathing Assistance: Maximum assistance Feeding assistance: Limited assistance Dressing Assistance: Maximum assistance     Functional Limitations Info             SPECIAL CARE FACTORS FREQUENCY  PT (By licensed PT), OT (By licensed OT), Speech therapy     PT Frequency: 5 times weekly OT Frequency: 5 times weekly     Speech Therapy Frequency: 5 times weekly      Contractures Contractures Info: Not present    Additional Factors Info  Code Status, Allergies, Psychotropic Code Status Info: full code Allergies Info: Iodine- anaphylaxis; Rice-anaphylaxis; Latex-itching; Penicillins-unkown reaction; Psychotropic Info: Haldol 5mg  TID PO  daily; Seroquel 25mg  BID PO daily         Current Medications (12/15/2020):  This is the current hospital active medication list Current Facility-Administered Medications  Medication Dose Route Frequency Provider Last Rate Last Admin   0.9 %  sodium chloride infusion   Intravenous PRN , MD   Stopped at 12/06/20 2235   [START ON 12/16/2020] amLODipine (NORVASC) tablet 10 mg  10 mg Oral Daily 12/08/20, PA-C       bethanechol (URECHOLINE) tablet 50 mg  50 mg Oral TID 2236, PA-C       bisacodyl (DULCOLAX) suppository 10 mg  10 mg Rectal Daily 02/15/2021, MD   10 mg at 12/14/20 0903   chlorhexidine (PERIDEX) 0.12 % solution 15 mL  15 mL Mouth Rinse BID Barnetta Chapel, MD   15 mL at 12/15/20 1329   Chlorhexidine Gluconate Cloth 2 % PADS 6 each  6 each Topical Q0600 02/13/21, MD   6 each at 12/15/20 0512   enoxaparin (LOVENOX) injection 30 mg  30 mg Subcutaneous Q12H 02/14/21, MD   30 mg at 12/15/20 1316   [START ON 12/16/2020] folic acid (FOLVITE) tablet 1 mg  1 mg Oral Daily Diamantina Monks, PA-C       glycopyrrolate (ROBINUL) tablet 1 mg  1 mg Oral TID 02/14/21, PA-C       guaiFENesin (ROBITUSSIN) 100 MG/5ML solution 300 mg  15 mL Oral Q4H PRN 02/15/2021, PA-C  haloperidol (HALDOL) tablet 5 mg  5 mg Oral TID Barnetta Chapel, PA-C       Or   haloperidol lactate (HALDOL) injection 2 mg  2 mg Intravenous TID Barnetta Chapel, PA-C       hydrALAZINE (APRESOLINE) injection 10 mg  10 mg Intravenous Q6H PRN Diamantina Monks, MD   10 mg at 12/12/20 0422   hydrALAZINE (APRESOLINE) tablet 50 mg  50 mg Oral Q8H Barnetta Chapel, PA-C       MEDLINE mouth rinse  15 mL Mouth Rinse q12n4p Diamantina Monks, MD   15 mL at 12/15/20 1206   metoprolol tartrate (LOPRESSOR) injection 5 mg  5 mg Intravenous Q4H PRN Violeta Gelinas, MD   5 mg at 12/11/20 1434   metoprolol tartrate (LOPRESSOR) tablet 25 mg  25 mg Oral BID Barnetta Chapel, PA-C       morphine 2  MG/ML injection 2 mg  2 mg Intravenous Q4H PRN Barnetta Chapel, PA-C   2 mg at 12/14/20 1715   ondansetron (ZOFRAN-ODT) disintegrating tablet 4 mg  4 mg Oral Q6H PRN Diamantina Monks, MD       Or   ondansetron (ZOFRAN) injection 4 mg  4 mg Intravenous Q6H PRN Diamantina Monks, MD       oxyCODONE (Oxy IR/ROXICODONE) immediate release tablet 5-10 mg  5-10 mg Oral Q4H PRN Barnetta Chapel, PA-C       QUEtiapine (SEROQUEL) tablet 25 mg  25 mg Oral BID Barnetta Chapel, PA-C       [START ON 12/16/2020] senna (SENOKOT) tablet 8.6 mg  1 tablet Oral Daily Barnetta Chapel, PA-C       sodium chloride flush (NS) 0.9 % injection 10-40 mL  10-40 mL Intracatheter Q12H Violeta Gelinas, MD   10 mL at 12/15/20 1331   sodium chloride flush (NS) 0.9 % injection 10-40 mL  10-40 mL Intracatheter PRN Violeta Gelinas, MD       tamsulosin [facility]) capsule 0.4 mg  0.4 mg Oral Daily Barnetta Chapel, PA-C       [START ON 12/16/2020] thiamine tablet 100 mg  100 mg Oral Daily Barnetta Chapel, PA-C         Discharge Medications: Please see discharge summary for a list of discharge medications.  Relevant Imaging Results:  Relevant Lab Results:   Additional Information SS # [id number]   Quintella Baton, RN, BSN  Trauma/Neuro ICU Case Manager [phone]

## 2020-12-15 NOTE — TOC Progression Note (Addendum)
Transition of Care [facility]) - Progression Note    Patient Details  Name: Benjamin Clements MRN: [id number] Date of Birth: 03-22-1961  Transition of Care [facility]) CM/SW Contact  Glennon Mac, RN Phone Number: 12/15/2020, 12:10pm  Clinical Narrative:    Spoke with patient's sister, Velna Hatchet, regarding discharge planning.  Sister states patient has 2 sons, though both work and would be unable to care for patient at discharge.  Sister states she is unable to care for him as well.  Patient remains disoriented, with PT/OT recommendations for skilled nursing facility at discharge.  Noted Cortrak has been discontinued, and patient has passed for Dysphagia 3 diet.  Sister is agreeable to patient information being faxed out to area skilled nursing facilities for placement.  She does understand that if no facility available in this area, we may have to consider sending his information out to facilities out of the area.  Will initiate FL 2 and fax out for bed search.  Placement will be difficult due to patient's history of drug use, Medicaid as only payor, and aggressive behavior.  Will follow with updates as they are available.  Expected Discharge Plan: Skilled Nursing Facility Barriers to Discharge: Continued Medical Work up  Expected Discharge Plan and Services Expected Discharge Plan: Skilled Nursing Facility   Discharge Planning Services: CM Consult   Living arrangements for the past 2 months: Boarding House                                       Social Determinants of Health (SDOH) Interventions    Readmission Risk Interventions No flowsheet data found.  Quintella Baton, RN, BSN  Trauma/Neuro ICU Case Manager [phone]

## 2020-12-15 NOTE — Progress Notes (Signed)
PT Cancellation Note  Patient Details Name: Benjamin Clements MRN: [id number] DOB: 12-12-1960   Cancelled Treatment:    Reason Eval/Treat Not Completed: Patient declined, no reason specified Patient adamantly refusing therapy  and agitated stating "I'm done with all therapy. I am healthy and have no health problems. I need the doctor's number ASAP. I'm getting out of here today." Patient cussing at therapist throughout. RN notified. PT will re-attempt as time allows.   Coben Godshall A. Dan Humphreys PT, DPT Acute Rehabilitation Services Pager [phone] Office [phone]    [name] 12/15/2020, 3:13 PM

## 2020-12-16 ENCOUNTER — Other Ambulatory Visit (HOSPITAL_COMMUNITY): Payer: Self-pay

## 2020-12-16 MED ORDER — HAEMOPHILUS B POLYSAC CONJ VAC IM SOLR
0.5000 mL | Freq: Once | INTRAMUSCULAR | Status: DC
  Filled 2020-12-16: qty 0.5

## 2020-12-16 MED ORDER — HALOPERIDOL 5 MG PO TABS
5.0000 mg | ORAL_TABLET | Freq: Every day | ORAL | 0 refills | Status: AC
  Filled 2020-12-16: qty 7, 7d supply, fill #0

## 2020-12-16 MED ORDER — MENINGOCOCCAL A C Y&W-135 OLIG IM SOLR
0.5000 mL | Freq: Once | INTRAMUSCULAR | Status: DC
  Filled 2020-12-16: qty 0.5

## 2020-12-16 MED ORDER — METOPROLOL TARTRATE 25 MG PO TABS
25.0000 mg | ORAL_TABLET | Freq: Every day | ORAL | 0 refills | Status: AC
  Filled 2020-12-16: qty 30, 30d supply, fill #0

## 2020-12-16 MED ORDER — PNEUMOCOCCAL 13-VAL CONJ VACC IM SUSP
0.5000 mL | Freq: Once | INTRAMUSCULAR | Status: DC
  Filled 2020-12-16: qty 0.5

## 2020-12-16 MED ORDER — OXYCODONE HCL 5 MG PO TABS
5.0000 mg | ORAL_TABLET | Freq: Four times a day (QID) | ORAL | 0 refills | Status: AC | PRN
  Filled 2020-12-16: qty 20, 5d supply, fill #0

## 2020-12-16 NOTE — Progress Notes (Signed)
PT Cancellation Note  Patient Details Name: Benjamin Clements MRN: [id number] DOB: 07/13/1960   Cancelled Treatment:    Reason Eval/Treat Not Completed: Other (comment) RN request to hold therapy today due to patient behavior. PT will follow up at later date.   [name] A. Dan Humphreys PT, DPT Acute Rehabilitation Services Pager [phone] Office [phone]    [name] 12/16/2020, 10:56 AM
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
## 2020-12-16 NOTE — Progress Notes (Signed)
OT Cancellation Note  Patient Details Name: Jakie Debow MRN: [id number] DOB: 08/24/1960   Cancelled Treatment:    Reason Eval/Treat Not Completed: Other (comment) (rn requests no attempts at this time for skilled therapies.)  Alessandra Bevels Lorraine-COTA/L 12/16/2020, 10:13 AM

## 2020-12-16 NOTE — TOC Transition Note (Addendum)
Transition of Care [facility]) - CM/SW Discharge Note   Patient Details  Name: Benjamin Clements MRN: [id number] Date of Birth: [birth year]  Transition of Care [facility]) CM/SW Contact:  Glennon Mac, RN Phone Number: 12/16/2020, 2:16 PM   Clinical Narrative:   Patient much improved today, and per MD, has capacity to make his own decisions.  Patient insistent on discharge today, and PA has spoken with patient's son, who plans to pick him up this afternoon.  He plans to stay with son tonight, and then get a hotel room.  Will refer patient for outpatient therapies at [facility]; hopeful that patient will take advantage of continued therapies.  Patient up in room and able to dress himself this morning; no DME requested.  Discharge prescriptions sent to [facility] pharmacy to be filled.  Will arrange first available follow-up appointment with [facility] clinic for primary care follow-up.  1430pm Addendum Received call from patient's son, Kendell Bane.  He has concerns about patient being discharged today, and requests that patient be placed in assisted living or skilled nursing facility.  Provided emotional support to son; explained that patient is now oriented and able to make his own decisions, and that we cannot keep him here if he does not want to be here.  Also explained that patient would likely be impossible to place in a facility due to his behavior issues and drug problems.  Kendell Bane states he appreciates my honesty and my speaking with him.  I reviewed outpatient follow-up with patient's son, and he states he will follow-up with his brother, Sam to see if he can make sure he gets to these appointments.  1600 Addendum Referral to [facility] for RW, to be delivered to bedside prior to dc.   Final next level of care: OP Rehab Barriers to Discharge: Barriers Resolved   Patient Goals and CMS Choice Patient states their goals for this hospitalization and ongoing recovery are:: to go home                             Discharge Plan and Services   Discharge Planning Services: Follow-up appt scheduled                                 Social Determinants of Health (SDOH) Interventions     Readmission Risk Interventions Readmission Risk Prevention Plan 12/16/2020  Transportation Screening Complete  PCP or Specialist Appt within 3-5 Days Not Complete  Not Complete comments No appointment available in 3-5 days  HRI or Home Care Consult Patient refused  Social Work Consult for Recovery Care Planning/Counseling Patient refused  Palliative Care Screening Not Applicable  Medication Review (RN Care Manager) Complete   Quintella Baton, RN, BSN  Trauma/Neuro ICU Case Manager [phone]

## 2020-12-16 NOTE — Discharge Instructions (Addendum)
You had a splenic laceration not a liver laceration, but this information is the same  Please take your Haldol for 7 sevens and this can be discontinued.

## 2020-12-17 NOTE — Progress Notes (Addendum)
This RN has attempted 3 times calling the son Cain Saupe.) that said he would pick up this patient with no success reaching with the son.   Mikki Harbor, RN

## 2020-12-17 NOTE — Progress Notes (Signed)
1540 PT found on floor had an unwitnessed physiological fall with no injury due to transporting himself from his chair to bed after being given reminders and interventions prior to to avoid getting out of his seat. PT fell on his bottom on the floor in between his chair and his bed. PT was educated again on how to call the nurse and was transferred to his bed. Trauma MD notified. No further action was needed.   Mikki Harbor, RN
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
## 2020-12-18 MED ORDER — OXYCODONE HCL 5 MG PO TABS: 5.0000 mg | ORAL_TABLET | ORAL | Status: DC | PRN

## 2020-12-18 NOTE — Progress Notes (Signed)
Pt chair alarm going off. Upon entrance into room, pt agitated saying that he was leaving. Pt unsteady on his feet even with walker. Pt aggressive with RN, pushing RN away from him and yelling he was leaving. Security called. Pt walked out into hallway with walker. Pt tripped over feet, RN had to assist pt and pt began yelling to get off of him and not to touch him. On call PA notified who stated pt could leave AMA. Pt has no ride. RN had already called multiple phone numbers that pt has wrote down on a piece of paper and unable to reach anyone. RN finally able to get Sam on the phone who stated that he is trying to get pts card. RN explained to Sam that pt is trying to leave and is becoming aggressive with staff. Sam stated that he understands but is only able to care for pt for a couple days and that he needs the card. Sam stated he was going to try to talk to his brother and would call pt back.
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
## 2020-12-18 NOTE — Progress Notes (Signed)
Transferred to floor for failure to discharge.

## 2020-12-19 ENCOUNTER — Other Ambulatory Visit (HOSPITAL_COMMUNITY): Payer: Self-pay

## 2020-12-19 ENCOUNTER — Encounter (HOSPITAL_COMMUNITY): Payer: Self-pay | Admitting: Emergency Medicine

## 2020-12-19 MED ORDER — ENSURE ENLIVE PO LIQD: 237.0000 mL | Freq: Three times a day (TID) | ORAL | Status: DC

## 2020-12-19 MED ORDER — ALUM & MAG HYDROXIDE-SIMETH 200-200-20 MG/5ML PO SUSP: 15.0000 mL | ORAL | 0 refills | Status: AC | PRN

## 2020-12-19 MED ORDER — ALUM & MAG HYDROXIDE-SIMETH 200-200-20 MG/5ML PO SUSP
15.0000 mL | ORAL | Status: DC | PRN
  Administered 2020-12-19: 15 mL via ORAL
  Filled 2020-12-19: qty 30
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
## 2020-12-19 NOTE — Plan of Care (Signed)
Patient is being discharged with son
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
## 2020-12-20 NOTE — Discharge Summary (Signed)
New date of discharge: 12/19/20  ADDENDUM: Patient's family did not arrive for 3 more days from date of previous DC summary to pick patient up.  He remained stable during this time with no further issues.  Letha Cape 8:25 AM 12/20/2020

## 2020-12-26 ENCOUNTER — Other Ambulatory Visit (HOSPITAL_BASED_OUTPATIENT_CLINIC_OR_DEPARTMENT_OTHER): Payer: Self-pay

## 2020-12-26 ENCOUNTER — Telehealth: Payer: Self-pay | Admitting: *Deleted
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
## 2020-12-27 ENCOUNTER — Other Ambulatory Visit (HOSPITAL_COMMUNITY): Payer: Self-pay

## 2022-07-13 IMAGING — CT CT HEAD W/O CM
4 series · 16 of 47 positions shown, 18 images · non-contrast
Comparison: 11/22/2020

CLINICAL DATA: Delirium, follow-up trauma

EXAM:
CT HEAD WITHOUT CONTRAST
TECHNIQUE: Contiguous axial images were obtained from the base of the skull
through the vertex without intravenous contrast.

[Series 3: head without · axial · non-contrast · 0.43mm/px · z∈[-40,+80]mm · 7 of 32 slices shown, 9 images]
[im 4/32  brain]
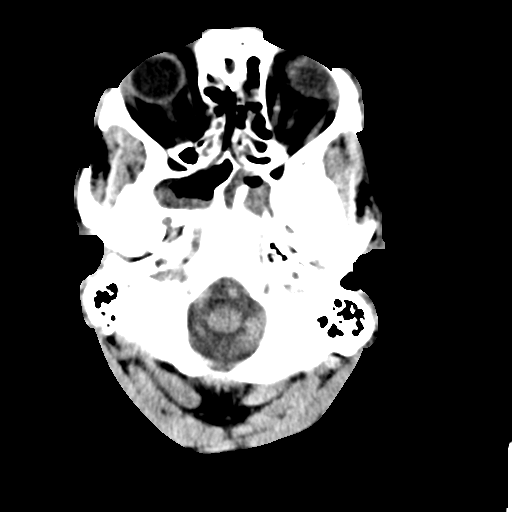
[im 4/32  bone]
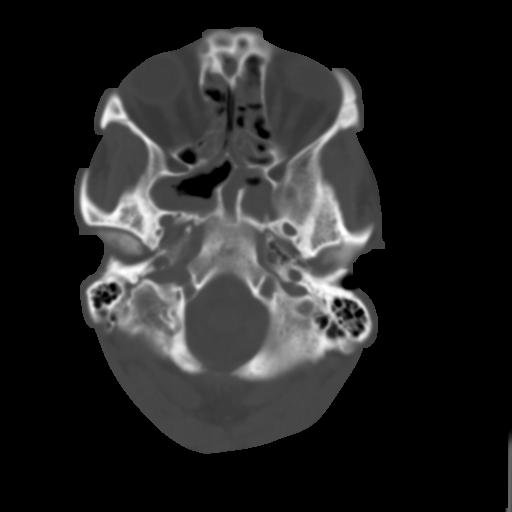
[im 8/32  brain]
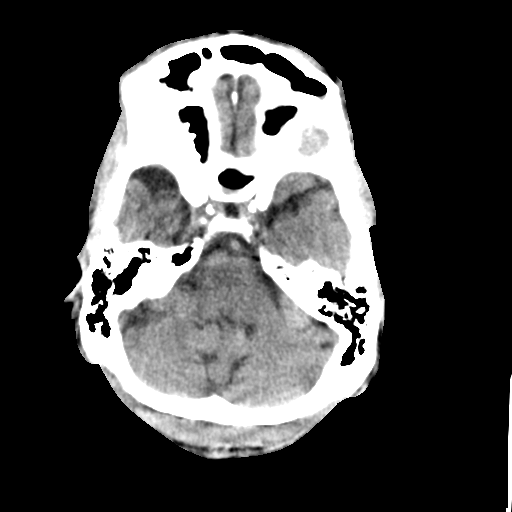
[im 12/32  brain]
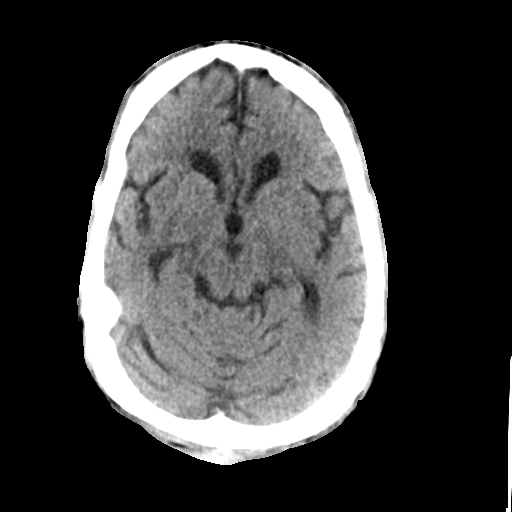
[im 16/32  brain]
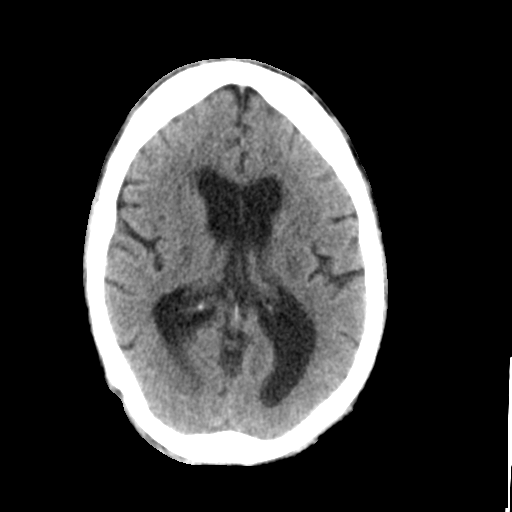
[im 20/32  brain]
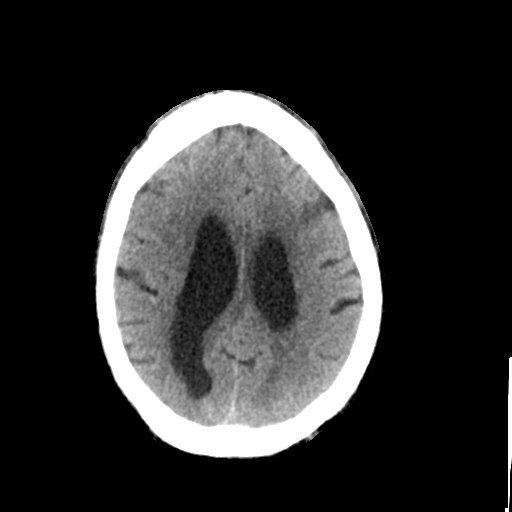
[im 20/32  bone]
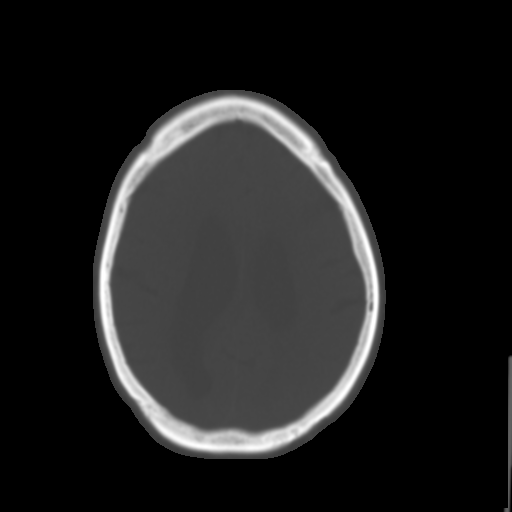
[im 24/32  brain]
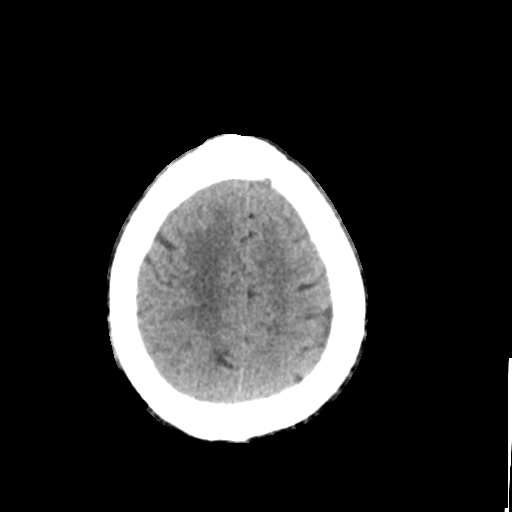
[im 28/32  brain]
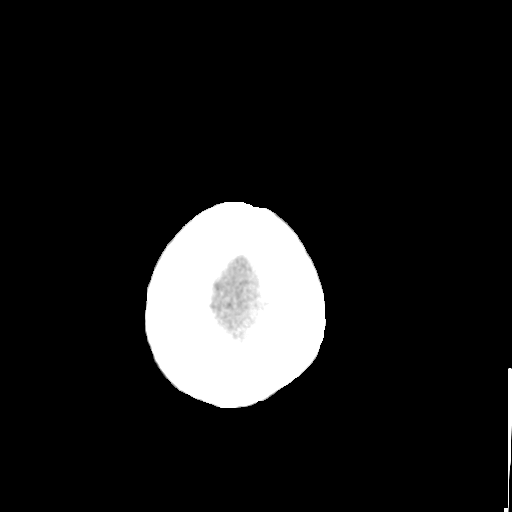

[Series 4: head bone · axial · 0.43mm/px · z∈[-41,-9]mm · 3 of 79 slices shown]
[im 8/79  bone]
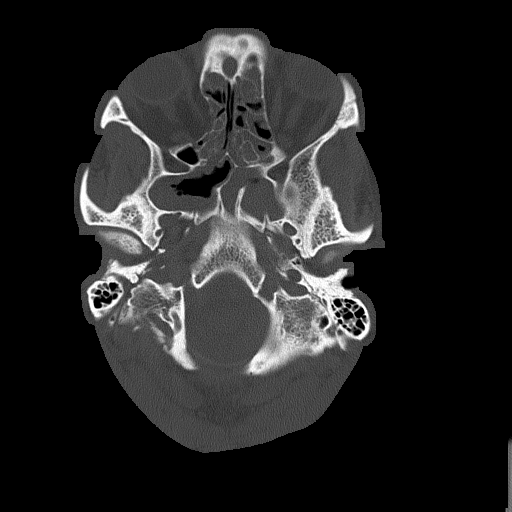
[im 16/79  bone]
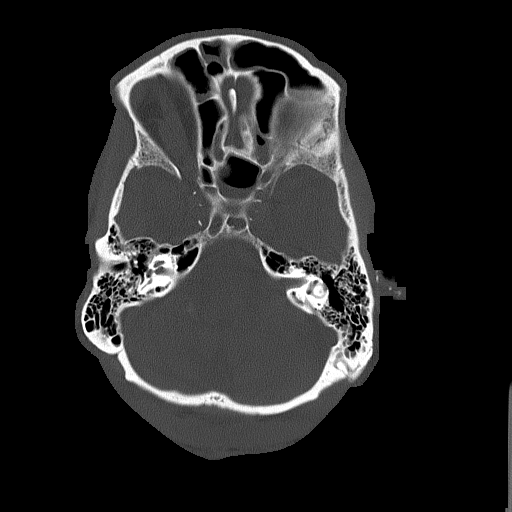
[im 24/79  bone]
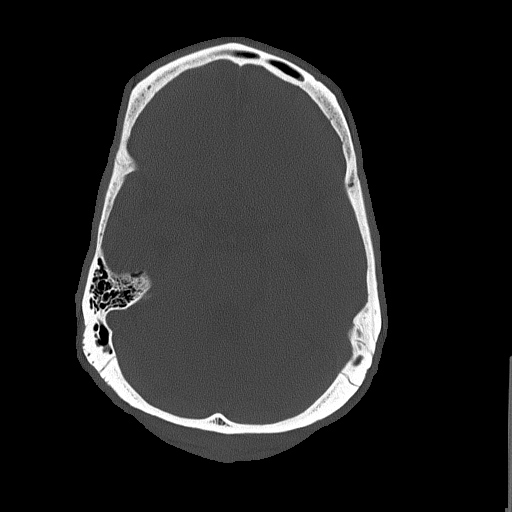

[Series 5: head without cor · coronal · non-contrast · 0.32mm/px · 3 of 62 slices shown]
[im 21/62  brain]
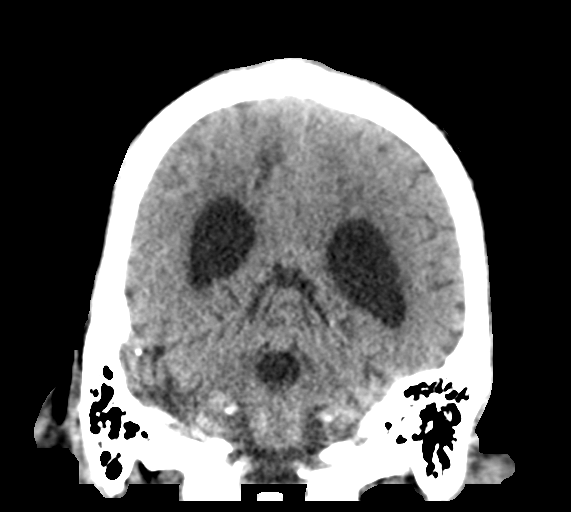
[im 28/62  brain]
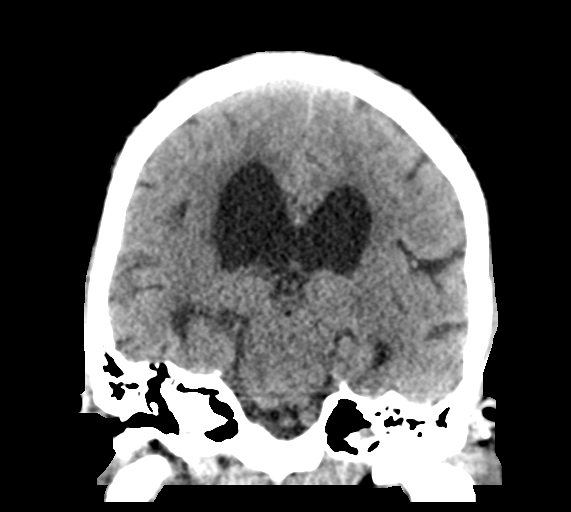
[im 34/62  brain]
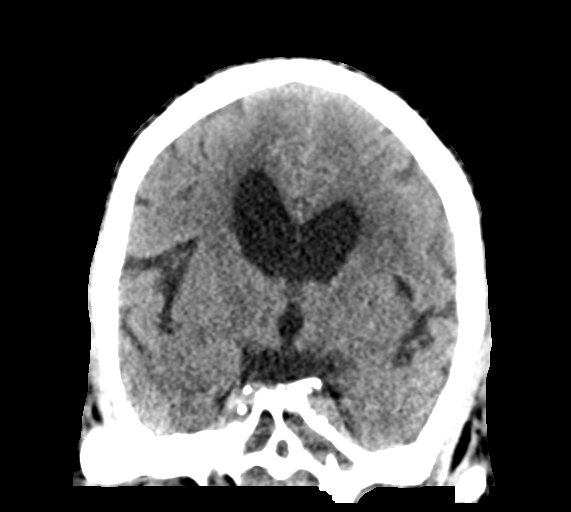

[Series 6: head without sag · sagittal · non-contrast · 0.30mm/px · 3 of 50 slices shown]
[im 17/50  brain]
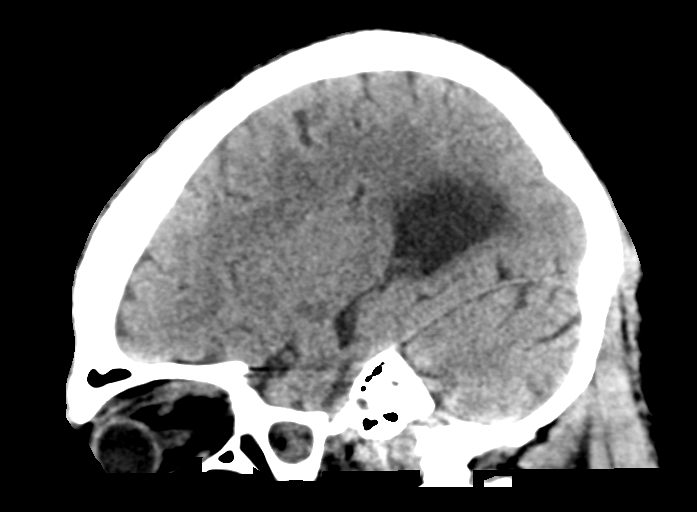
[im 25/50  brain]
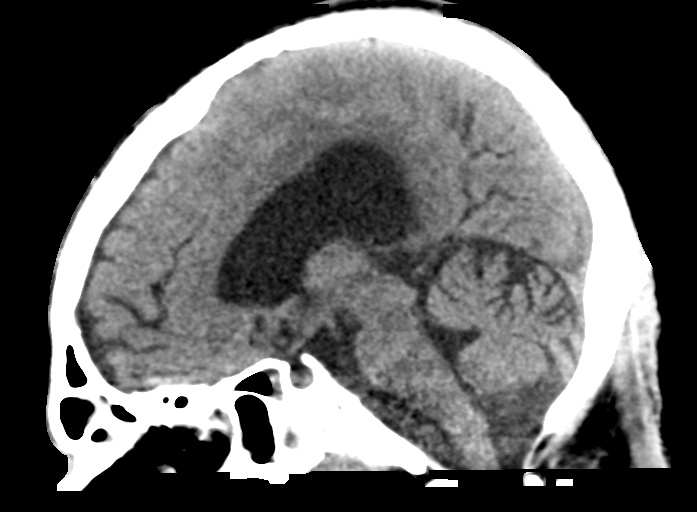
[im 33/50  brain]
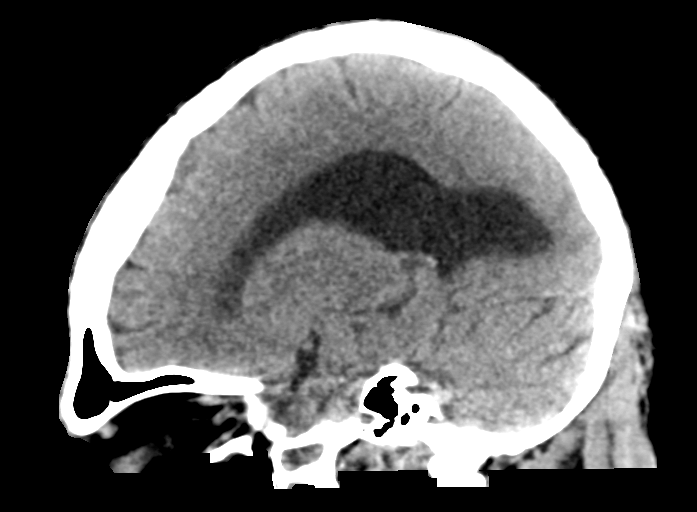

[16 of 47 positions shown; findings below may reference images not displayed]

FINDINGS: Brain: No evidence of acute infarction, hemorrhage, hydrocephalus,
extra-axial collection or mass lesion/mass effect. Mild
periventricular and deep white matter hypodensity. Mild global
cerebral atrophy resulting in prominence of the lateral ventricles
ex vacuo.

Vascular: No hyperdense vessel or unexpected calcification.

Skull: Normal. Negative for fracture or focal lesion.

Sinuses/Orbits: New, extensive mucosal thickening and partial
opacification of the included paranasal sinuses and ethmoid air
cells.

Other: None.
IMPRESSION: 1. No acute intracranial pathology. Small-vessel white matter
disease. Mild global cerebral atrophy.
2. New, extensive mucosal thickening and partial opacification of
the included paranasal sinuses and ethmoid air cells. Correlate for
sinusitis, although this appearance is nonspecific in the setting of
endotracheal intubation.
